# Patient Record
Sex: Male | Born: 1955 | Race: White | Hispanic: No | Marital: Married | Smoking: Never smoker
Health system: Southern US, Community
[De-identification: ages and names within clinical notes are randomized; demographics above are authoritative.]

## PROBLEM LIST (undated history)

## (undated) DIAGNOSIS — K469 Unspecified abdominal hernia without obstruction or gangrene: Secondary | ICD-10-CM

## (undated) DIAGNOSIS — J45909 Unspecified asthma, uncomplicated: Secondary | ICD-10-CM

## (undated) DIAGNOSIS — E119 Type 2 diabetes mellitus without complications: Secondary | ICD-10-CM

## (undated) DIAGNOSIS — G8220 Paraplegia, unspecified: Secondary | ICD-10-CM

## (undated) DIAGNOSIS — K219 Gastro-esophageal reflux disease without esophagitis: Secondary | ICD-10-CM

## (undated) DIAGNOSIS — N419 Inflammatory disease of prostate, unspecified: Secondary | ICD-10-CM

## (undated) DIAGNOSIS — G629 Polyneuropathy, unspecified: Secondary | ICD-10-CM

## (undated) DIAGNOSIS — R7301 Impaired fasting glucose: Secondary | ICD-10-CM

## (undated) HISTORY — DX: Impaired fasting glucose: R73.01

## (undated) HISTORY — DX: Unspecified abdominal hernia without obstruction or gangrene: K46.9

## (undated) HISTORY — DX: Polyneuropathy, unspecified: G62.9

## (undated) HISTORY — DX: Gastro-esophageal reflux disease without esophagitis: K21.9

## (undated) HISTORY — PX: SHOULDER ARTHROSCOPY: SHX128

## (undated) HISTORY — DX: Unspecified asthma, uncomplicated: J45.909

## (undated) HISTORY — PX: CHOLECYSTECTOMY: SHX55

---

## 1997-11-14 ENCOUNTER — Ambulatory Visit (HOSPITAL_COMMUNITY): Admission: RE | Admit: 1997-11-14 | Discharge: 1997-11-14 | Payer: Self-pay | Admitting: Cardiovascular Disease

## 2003-07-31 ENCOUNTER — Ambulatory Visit (HOSPITAL_COMMUNITY): Admission: RE | Admit: 2003-07-31 | Discharge: 2003-07-31 | Payer: Self-pay | Admitting: Family Medicine

## 2005-04-15 ENCOUNTER — Ambulatory Visit (HOSPITAL_COMMUNITY): Admission: RE | Admit: 2005-04-15 | Discharge: 2005-04-15 | Payer: Self-pay | Admitting: Family Medicine

## 2007-03-07 ENCOUNTER — Ambulatory Visit (HOSPITAL_COMMUNITY): Admission: RE | Admit: 2007-03-07 | Discharge: 2007-03-07 | Payer: Self-pay | Admitting: Family Medicine

## 2008-06-13 ENCOUNTER — Encounter (HOSPITAL_COMMUNITY): Admission: RE | Admit: 2008-06-13 | Discharge: 2008-07-13 | Payer: Self-pay | Admitting: Family Medicine

## 2008-12-26 ENCOUNTER — Ambulatory Visit: Payer: Self-pay | Admitting: Internal Medicine

## 2008-12-26 DIAGNOSIS — G4730 Sleep apnea, unspecified: Secondary | ICD-10-CM | POA: Insufficient documentation

## 2008-12-26 DIAGNOSIS — K219 Gastro-esophageal reflux disease without esophagitis: Secondary | ICD-10-CM

## 2008-12-26 DIAGNOSIS — R059 Cough, unspecified: Secondary | ICD-10-CM | POA: Insufficient documentation

## 2008-12-26 DIAGNOSIS — R05 Cough: Secondary | ICD-10-CM

## 2009-01-12 ENCOUNTER — Encounter: Payer: Self-pay | Admitting: Internal Medicine

## 2009-02-05 ENCOUNTER — Encounter (INDEPENDENT_AMBULATORY_CARE_PROVIDER_SITE_OTHER): Payer: Self-pay | Admitting: Surgery

## 2009-02-05 ENCOUNTER — Ambulatory Visit (HOSPITAL_COMMUNITY): Admission: RE | Admit: 2009-02-05 | Discharge: 2009-02-05 | Payer: Self-pay | Admitting: Surgery

## 2009-03-16 ENCOUNTER — Encounter (INDEPENDENT_AMBULATORY_CARE_PROVIDER_SITE_OTHER): Payer: Self-pay | Admitting: *Deleted

## 2009-04-02 ENCOUNTER — Encounter (INDEPENDENT_AMBULATORY_CARE_PROVIDER_SITE_OTHER): Payer: Self-pay

## 2009-04-03 ENCOUNTER — Ambulatory Visit: Payer: Self-pay | Admitting: Gastroenterology

## 2009-04-14 ENCOUNTER — Ambulatory Visit: Payer: Self-pay | Admitting: Gastroenterology

## 2009-04-26 ENCOUNTER — Encounter: Payer: Self-pay | Admitting: Gastroenterology

## 2010-05-02 DIAGNOSIS — K469 Unspecified abdominal hernia without obstruction or gangrene: Secondary | ICD-10-CM

## 2010-05-02 HISTORY — DX: Unspecified abdominal hernia without obstruction or gangrene: K46.9

## 2010-05-02 HISTORY — PX: HERNIA REPAIR: SHX51

## 2010-08-05 LAB — URINALYSIS, ROUTINE W REFLEX MICROSCOPIC
Bilirubin Urine: NEGATIVE
Glucose, UA: NEGATIVE mg/dL
Hgb urine dipstick: NEGATIVE
Ketones, ur: NEGATIVE mg/dL
Nitrite: NEGATIVE
Protein, ur: NEGATIVE mg/dL
Specific Gravity, Urine: 1.025 (ref 1.005–1.030)
Urobilinogen, UA: 0.2 mg/dL (ref 0.0–1.0)
pH: 5.5 (ref 5.0–8.0)

## 2010-08-05 LAB — DIFFERENTIAL
Basophils Absolute: 0.1 10*3/uL (ref 0.0–0.1)
Basophils Relative: 1 % (ref 0–1)
Eosinophils Absolute: 0.3 10*3/uL (ref 0.0–0.7)
Eosinophils Relative: 5 % (ref 0–5)
Lymphocytes Relative: 36 % (ref 12–46)
Lymphs Abs: 2.8 10*3/uL (ref 0.7–4.0)
Monocytes Absolute: 0.6 10*3/uL (ref 0.1–1.0)
Monocytes Relative: 9 % (ref 3–12)
Neutro Abs: 3.8 10*3/uL (ref 1.7–7.7)
Neutrophils Relative %: 50 % (ref 43–77)

## 2010-08-05 LAB — CBC
HCT: 44.4 % (ref 39.0–52.0)
Hemoglobin: 15.2 g/dL (ref 13.0–17.0)
MCHC: 34.3 g/dL (ref 30.0–36.0)
MCV: 91.8 fL (ref 78.0–100.0)
Platelets: 231 10*3/uL (ref 150–400)
RBC: 4.84 MIL/uL (ref 4.22–5.81)
RDW: 13.3 % (ref 11.5–15.5)
WBC: 7.6 10*3/uL (ref 4.0–10.5)

## 2010-08-05 LAB — COMPREHENSIVE METABOLIC PANEL
ALT: 39 U/L (ref 0–53)
AST: 37 U/L (ref 0–37)
Albumin: 3.7 g/dL (ref 3.5–5.2)
Alkaline Phosphatase: 68 U/L (ref 39–117)
BUN: 8 mg/dL (ref 6–23)
CO2: 26 mEq/L (ref 19–32)
Calcium: 9 mg/dL (ref 8.4–10.5)
Chloride: 111 mEq/L (ref 96–112)
Creatinine, Ser: 1.14 mg/dL (ref 0.4–1.5)
GFR calc Af Amer: >60 mL/min (ref >60)
GFR calc non Af Amer: >60 mL/min (ref >60)
Glucose, Bld: 107 mg/dL — ABNORMAL HIGH (ref 70–99)
Potassium: 4.3 mEq/L (ref 3.5–5.1)
Sodium: 143 mEq/L (ref 135–145)
Total Bilirubin: 0.7 mg/dL (ref 0.3–1.2)
Total Protein: 6 g/dL (ref 6.0–8.3)

## 2010-10-20 DIAGNOSIS — K429 Umbilical hernia without obstruction or gangrene: Secondary | ICD-10-CM | POA: Insufficient documentation

## 2010-11-09 ENCOUNTER — Encounter (INDEPENDENT_AMBULATORY_CARE_PROVIDER_SITE_OTHER): Payer: Self-pay | Admitting: Surgery

## 2010-11-09 ENCOUNTER — Ambulatory Visit (INDEPENDENT_AMBULATORY_CARE_PROVIDER_SITE_OTHER): Payer: 59 | Admitting: Surgery

## 2010-11-09 VITALS — BP 142/92 | HR 72 | Temp 96.8°F | Ht 67.0 in | Wt 254.8 lb

## 2010-11-09 DIAGNOSIS — K429 Umbilical hernia without obstruction or gangrene: Secondary | ICD-10-CM

## 2010-11-09 NOTE — Progress Notes (Signed)
Subjective:     Patient ID: Eddie Foley, male   DOB: 1956/05/02, 55 y.o.   MRN: [id number]    BP 142/92  Pulse 72  Temp 96.8 F (36 C)  Ht 5\' 7"  (1.702 m)  Wt 254 lb 12.8 oz (115.577 kg)  BMI 39.91 kg/m2    HPIThis patient developed a bulge just above his umbilicus a few weeks ago. It was quite painful. It measured about 4 cm across by his description. Since then it has come and gone. When he lies down he can get it to go back inside. He saw his primary physician and was thought to have an umbilical hernia and referred to [name] for surgical evaluation. Currently it is reduced and it will not come out he coughs or strains. He points to the area just above the umbilicus as the site of the bulge.   Review of Systems  Constitutional: Negative.   HENT: Negative.   Eyes: Negative.   Respiratory:       Has recurrent bronchitis from reflux  Gastrointestinal:       Significant reflux  Musculoskeletal: Negative.   Neurological: Negative.   Hematological: Negative.   Psychiatric/Behavioral: Negative.   No Known Allergies Past Medical History  Diagnosis Date  . Hernia   . GERD (gastroesophageal reflux disease)    Past Surgical History  Procedure Date  . Cholecystectomy    History  Substance Use Topics  . Smoking status: Never Smoker   . Smokeless tobacco: Not on file  . Alcohol Use: No   Current Outpatient Prescriptions  Medication Sig Dispense Refill  . Cholecalciferol (VITAMIN D3) 3000 UNITS TABS Take by mouth daily.        . famotidine (PEPCID) 20 MG tablet Take 20 mg by mouth daily.        Marland Kitchen ibuprofen (ADVIL,MOTRIN) 200 MG tablet Take 200 mg by mouth every 6 (six) hours as needed.        . Probiotic Product (PROBIOTIC PO) Take by mouth daily.        Marland Kitchen NEXIUM 40 MG capsule daily.             Objective:   Physical Exam  Constitutional: He appears well-developed and well-nourished.  Cardiovascular: Normal rate, regular rhythm and normal heart sounds.     Pulmonary/Chest: Effort normal and breath sounds normal.  Abdominal: Soft. Bowel sounds are normal. He exhibits no distension. There is no tenderness. There is no rebound and no guarding.       Possible small bulge just above the umbilicus.       Assessment:     Umbilical hernia reducible Severe reflux    Plan:     I have recommended that he undergo repair of his umbilical hernia. I told him that we will need to use mesh. I have gone over the risks including infection and recurrences. I think understands about like to go ahead and be scheduled.  I also suggested that he consider further GI evaluation. He has significant reflux, has had chest pain necessitating a cardiac workup because of reflux, and is on more than one medication for his reflux symptoms. In addition he apparently has episodes of bronchial irritation from the reflux. I wonder if he might benefit from an anti-reflux procedure. He will discuss that with his primary care physician

## 2010-11-09 NOTE — Patient Instructions (Signed)
We will schedule surgery for repair of your umbilical hernia Check with your primary care physician to see if you might be a candidate for surgery for your chronic reflux

## 2010-11-25 ENCOUNTER — Encounter (HOSPITAL_COMMUNITY)
Admission: RE | Admit: 2010-11-25 | Discharge: 2010-11-25 | Disposition: A | Discharge status: Home / Self Care | Payer: 59 | Source: Ambulatory Visit | Attending: Surgery | Admitting: Surgery

## 2010-11-25 LAB — SURGICAL PCR SCREEN
MRSA, PCR: NEGATIVE
Staphylococcus aureus: NEGATIVE

## 2010-11-25 LAB — CBC
HCT: 44.7 % (ref 39.0–52.0)
Hemoglobin: 15.5 g/dL (ref 13.0–17.0)
MCH: 30.8 pg (ref 26.0–34.0)
MCHC: 34.7 g/dL (ref 30.0–36.0)
MCV: 88.7 fL (ref 78.0–100.0)
Platelets: 209 10*3/uL (ref 150–400)
RBC: 5.04 MIL/uL (ref 4.22–5.81)
RDW: 13 % (ref 11.5–15.5)
WBC: 9.8 10*3/uL (ref 4.0–10.5)

## 2010-12-02 ENCOUNTER — Ambulatory Visit (HOSPITAL_COMMUNITY)
Admission: RE | Admit: 2010-12-02 | Discharge: 2010-12-02 | Disposition: A | Discharge status: Home / Self Care | Payer: 59 | Source: Ambulatory Visit | Attending: Surgery | Admitting: Surgery

## 2010-12-02 DIAGNOSIS — K219 Gastro-esophageal reflux disease without esophagitis: Secondary | ICD-10-CM | POA: Insufficient documentation

## 2010-12-02 DIAGNOSIS — Z01812 Encounter for preprocedural laboratory examination: Secondary | ICD-10-CM | POA: Insufficient documentation

## 2010-12-02 DIAGNOSIS — K429 Umbilical hernia without obstruction or gangrene: Secondary | ICD-10-CM | POA: Insufficient documentation

## 2010-12-02 DIAGNOSIS — E669 Obesity, unspecified: Secondary | ICD-10-CM | POA: Insufficient documentation

## 2010-12-07 NOTE — Op Note (Addendum)
  NAMEKYLEY, Eddie Foley               ACCOUNT NO.:  [id number]  MEDICAL RECORD NO.:  [id number]  LOCATION:  SDS                          FACILITY:  [facility]  PHYSICIAN:  Currie Paris, M.D.DATE OF BIRTH:  04/21/1956  DATE OF PROCEDURE:  12/02/2010 DATE OF DISCHARGE:  11/25/2010                              OPERATIVE REPORT   PREOPERATIVE DIAGNOSIS:  Umbilical hernia.  POSTOPERATIVE DIAGNOSIS:  Umbilical hernia.  PROCEDURE:  Repair of umbilical hernia with mesh.  SURGEON:  Currie Paris, MD  ANESTHESIA:  General endotracheal.  CLINICAL HISTORY:  This patient presented with an umbilical hernia that had become quite painful, but was reducible.  He wished to have this repaired.  DESCRIPTION OF PROCEDURE:  I saw the patient in the holding area and he had no further questions.  We confirmed the plans for surgery as noted above.  The patient was taken to the operating room, and after satisfactory general endotracheal anesthesia had been obtained, the abdomen was clipped, prepped, and draped.  The time-out was done.  I made a curvilinear infraumbilical incision and elevated the umbilical skin off the hernia sac and entered the sac in doing so.  I cleaned the sac off the surrounding tissue.  I was able to insert my finger into the abdomen and the defect was about 2.5 cm across as a single defect. There were no other defects that I could palpate within reach of my finger in all directions.  I closed the small opening in the peritoneal sac and reduced it.  I cleaned off the fascia circumferentially around the defect.  I took a piece of Marlex mesh and fashioned a piece of it into a cone- shaped plug and placed this into the fascial defect.  I then used five sutures 0 Prolene to close the defect closing fascia to the broad end of the mesh plug to the fascia.  This all tied down easily and with no tension, and I thought it provided a good repair.  I made sure everything  was dry.  I closed in layers with 3-0 Vicryl, 4-0 Monocryl subcuticular and some Steri-Strips.  I put a cotton ball with some mineral oil to hold "any" down and sterile dressings on top.  The patient tolerated the procedure well and there were no complications.  All  counts were correct.     Currie Paris, M.D.     CJS/MEDQ  D:  12/02/2010  T:  12/02/2010  Job:  [id number]  cc:   Donna Bernard, M.D.  Electronically Signed by Cyndia Bent M.D. on 12/14/2010 07:52:45 AM

## 2010-12-22 ENCOUNTER — Encounter (INDEPENDENT_AMBULATORY_CARE_PROVIDER_SITE_OTHER): Payer: Self-pay | Admitting: Surgery

## 2010-12-22 ENCOUNTER — Ambulatory Visit (INDEPENDENT_AMBULATORY_CARE_PROVIDER_SITE_OTHER): Payer: 59 | Admitting: Surgery

## 2010-12-22 VITALS — BP 142/92 | HR 64

## 2010-12-22 DIAGNOSIS — Z9889 Other specified postprocedural states: Secondary | ICD-10-CM

## 2010-12-22 NOTE — Patient Instructions (Signed)
We will see you again on an as needed basis. Please call the office at [phone] if you have any questions or concerns. Thank you for allowing us to take care of you.  

## 2010-12-22 NOTE — Progress Notes (Signed)
CC: Postop is  HPI: This patient comes in for post op follow-up. He underwent repair of umbilical hernia on December 01, 2009. He feels that he is doing well. He has had minimal pain. He only took pain medications for two days. He feels significantly improved.  PE: General: The patient appears to be healthy, NAD  Soft and benign. There is minor ridging at the top of the umbilicus. There is no evidence of infection. The repair appears intact.  IMPRESSION: The patient is doing well S/P umbilical hernia repair.  DATA REVIWED: No new data  PLAN: Recheck here p.r.n.

## 2012-11-16 ENCOUNTER — Other Ambulatory Visit: Payer: Self-pay | Admitting: Family Medicine

## 2013-04-23 ENCOUNTER — Encounter: Payer: Self-pay | Admitting: Family Medicine

## 2013-05-12 ENCOUNTER — Other Ambulatory Visit: Payer: Self-pay | Admitting: Family Medicine

## 2013-05-13 ENCOUNTER — Ambulatory Visit: Payer: Self-pay | Admitting: Family Medicine

## 2013-05-13 ENCOUNTER — Telehealth: Payer: Self-pay | Admitting: Family Medicine

## 2013-05-13 MED ORDER — HYDROCODONE-ACETAMINOPHEN 10-325 MG PO TABS: 1.0000 | ORAL_TABLET | Freq: Four times a day (QID) | ORAL | Status: DC | PRN

## 2013-05-13 NOTE — Telephone Encounter (Signed)
May refill x1, explained to patient that nail this type of medicine will require office visit every 3 month visit for further medicines.

## 2013-05-13 NOTE — Telephone Encounter (Signed)
Patient needs Rx for hydrocodone 10mg 

## 2013-05-13 NOTE — Telephone Encounter (Signed)
Not seen since Epic.

## 2013-05-13 NOTE — Telephone Encounter (Signed)
Appointment tomorrow for chronic pain per Dr. Nicki Reaper with him ONLY. Script ready for pickup. Wife was notified and transferred to front desk to schedule appointment.

## 2013-06-04 ENCOUNTER — Other Ambulatory Visit: Payer: Self-pay | Admitting: Family Medicine

## 2013-06-05 ENCOUNTER — Telehealth: Payer: Self-pay | Admitting: Family Medicine

## 2013-06-05 NOTE — Telephone Encounter (Signed)
Patient needs Rx for Nexium to [facility].

## 2013-06-05 NOTE — Telephone Encounter (Addendum)
Request was already sent via e script and was sent electronically back to pharmacy for #14 -Patient not seen in a year and needs office visit.

## 2013-06-13 ENCOUNTER — Other Ambulatory Visit: Payer: Self-pay | Admitting: Family Medicine

## 2013-08-06 ENCOUNTER — Encounter: Payer: Self-pay | Admitting: *Deleted

## 2014-01-31 ENCOUNTER — Encounter: Payer: Self-pay | Admitting: Gastroenterology

## 2014-06-27 ENCOUNTER — Other Ambulatory Visit: Payer: Self-pay | Admitting: *Deleted

## 2014-06-27 ENCOUNTER — Encounter: Payer: Self-pay | Admitting: Family Medicine

## 2014-06-27 ENCOUNTER — Ambulatory Visit (INDEPENDENT_AMBULATORY_CARE_PROVIDER_SITE_OTHER): Payer: BLUE CROSS/BLUE SHIELD | Admitting: Family Medicine

## 2014-06-27 VITALS — BP 132/96 | Temp 98.9°F | Ht 67.0 in | Wt 261.0 lb

## 2014-06-27 DIAGNOSIS — Z13220 Encounter for screening for lipoid disorders: Secondary | ICD-10-CM | POA: Diagnosis not present

## 2014-06-27 DIAGNOSIS — N410 Acute prostatitis: Secondary | ICD-10-CM | POA: Diagnosis not present

## 2014-06-27 DIAGNOSIS — R300 Dysuria: Secondary | ICD-10-CM | POA: Diagnosis not present

## 2014-06-27 DIAGNOSIS — Z125 Encounter for screening for malignant neoplasm of prostate: Secondary | ICD-10-CM

## 2014-06-27 LAB — POCT URINALYSIS DIPSTICK
SPEC GRAV UA: 1.02
pH, UA: 5

## 2014-06-27 MED ORDER — CIPROFLOXACIN HCL 500 MG PO TABS: 500.0000 mg | ORAL_TABLET | Freq: Two times a day (BID) | ORAL | Status: DC

## 2014-06-27 MED ORDER — HYDROCODONE-ACETAMINOPHEN 5-325 MG PO TABS: 1.0000 | ORAL_TABLET | Freq: Four times a day (QID) | ORAL | Status: DC | PRN

## 2014-06-27 NOTE — Patient Instructions (Signed)

## 2014-06-27 NOTE — Progress Notes (Signed)
   Subjective:    Patient ID: Eddie Foley, male    DOB: 01/11/1956, 59 y.o.   MRN: [id number]  Dysuria  This is a new problem. Episode onset: 8 days ago. Associated symptoms include frequency and nausea. He has tried NSAIDs for the symptoms.    Patient states that he has had some urinary from C lower abdominal discomfort bilateral midabdomen denies new vomiting diarrhea. Denies high fever chills or sweats. States energy level is poor. 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  Review of Systems  Constitutional: Negative for fatigue.  Respiratory: Negative for cough.   Cardiovascular: Negative for chest pain.  Gastrointestinal: Positive for nausea and abdominal pain. Negative for diarrhea.  Genitourinary: Positive for dysuria and frequency.       Objective:   Physical Exam  Constitutional: He appears well-nourished. No distress.  Cardiovascular: Normal rate, regular rhythm and normal heart sounds.   No murmur heard. Pulmonary/Chest: Effort normal and breath sounds normal. No respiratory distress.  Abdominal: Soft. There is tenderness (patient has some tenderness more in the lower bladder region a little bit in the left lower right lower quadrant in the middle of the abdomen as well.).  Genitourinary:  Prostate exam severe tenderness in large swollen no hard nodules  Musculoskeletal: He exhibits no edema.  Lymphadenopathy:    He has no cervical adenopathy.  Neurological: He is alert.  Psychiatric: His behavior is normal.  Vitals reviewed.   Mild lower abdominal tenderness enlarged prostate moderate tenderness and pain      Assessment & Plan:  I would recommend recheck this patient back in several weeks' time also recommend for him to do lab work in several weeks time. Use Cipro 500 mg twice a day over the course of the next 3 weeks. Pain medication as directed. Warning signs discuss of high fever chills abdominal pain or does not tolerate antibiotic or if not  significantly improving over the next 7 days call us immediately

## 2014-06-29 LAB — URINE CULTURE
Colony Count: NO GROWTH
Organism ID, Bacteria: NO GROWTH

## 2014-07-07 ENCOUNTER — Telehealth: Payer: Self-pay | Admitting: Family Medicine

## 2014-07-07 NOTE — Telephone Encounter (Signed)
Patient seen 2/26 given Cipro and Hydrocodone- patient no better- continued back pain into kidneys-sharp pain in prostate and on right side -does he need to be reseen

## 2014-07-07 NOTE — Telephone Encounter (Signed)
Pts spouse is calling to state that he really is not any better. He now has back pain  To the kidney area around his back. Sharp pain on the right side from the prostate.  Does he need to be rechecked? Tested? Or another antibiotic?   [facility]

## 2014-07-07 NOTE — Telephone Encounter (Signed)
Pt needs to be seen, he will need further testing and may need referral as well. Talk with pt needs ov T  Or weds depending on Sx

## 2014-07-07 NOTE — Telephone Encounter (Signed)
Office visit scheduled tomorrow for recheck-ER tonight if worse.

## 2014-07-08 ENCOUNTER — Ambulatory Visit (HOSPITAL_COMMUNITY)
Admission: RE | Admit: 2014-07-08 | Discharge: 2014-07-08 | Disposition: A | Discharge status: Home / Self Care | Payer: BLUE CROSS/BLUE SHIELD | Source: Ambulatory Visit | Attending: Family Medicine | Admitting: Family Medicine

## 2014-07-08 ENCOUNTER — Ambulatory Visit (INDEPENDENT_AMBULATORY_CARE_PROVIDER_SITE_OTHER): Payer: BLUE CROSS/BLUE SHIELD | Admitting: Family Medicine

## 2014-07-08 ENCOUNTER — Encounter: Payer: Self-pay | Admitting: Family Medicine

## 2014-07-08 VITALS — BP 150/92 | Ht 67.0 in | Wt 256.0 lb

## 2014-07-08 DIAGNOSIS — M545 Low back pain: Secondary | ICD-10-CM | POA: Diagnosis not present

## 2014-07-08 DIAGNOSIS — M5442 Lumbago with sciatica, left side: Secondary | ICD-10-CM

## 2014-07-08 MED ORDER — PREDNISONE 20 MG PO TABS: ORAL_TABLET | ORAL | Status: DC

## 2014-07-08 MED ORDER — HYDROCODONE-ACETAMINOPHEN 10-325 MG PO TABS: 1.0000 | ORAL_TABLET | Freq: Four times a day (QID) | ORAL | Status: DC | PRN

## 2014-07-08 NOTE — Progress Notes (Signed)
   Subjective:    Patient ID: Eddie Foley, male    DOB: Dec 05, 1955, 59 y.o.   MRN: [id number]  HPI Comments: Still having slight pelvic pain, but 80% better.  Back Pain This is a chronic problem. Episode onset: Sunday. The problem occurs constantly. The problem has been gradually worsening since onset. The pain is present in the gluteal. The symptoms are aggravated by standing. Stiffness is present in the morning. Associated symptoms include numbness and tingling. He has tried heat, muscle relaxant and analgesics for the symptoms. The treatment provided no relief.   Patient relates that he is having severe back pain wakes him up at night. He relates the pain radiates into both thighs. He states he has a hard time getting out of a chair without his wife helping him up. In addition to this he has a hard time getting in and out of the truck. This pain is been going on for a good 6-8 weeks. It got worse over the weekend. He also relates that he's been having spells like this off and on over the past several years that he often treats with anti-inflammatories and stretching but it's been progressively worse over the past year. He states that it's to the point where he really can't continue his work. He states it is not interrupting urination or bowel movements. He did try taking hydrocodone and Advil for neither one really helped a lot. Patient had MRI back in 2005.   Review of Systems  Musculoskeletal: Positive for back pain.  Neurological: Positive for tingling and numbness.   see above. ( Fridays best for MRI )    Objective:   Physical Exam Patient appears to be uncomfortable. His lungs are clear respiratory rate is normal heart is regular pulses normal extremities no edema positive straight leg raise on both sides worse on the right than the left abdomen is soft minimal lower abdominal tenderness on the left side lumbar area subjective pain and discomfort severely limited range of motion has  difficult time rotating has difficult time standing up straight difficult time with extension. He cannot get out of the chair without his wife helping him he cannot sit up on the exam table without help.       Assessment & Plan:  Severe lumbar pain with sciatica bilateral worse on the right than the left-given that this is been present for months off and on plus also severe pain over the past 6 weeks worse over the past week I would recommend lumbar x-rays. I believe this patient will need MRI of his lumbar spine. We will pursue that.  Patient is aware that if his insurance company does not cover this we may need to do 6 weeks of anti-inflammatory stretching. He essentially has Arty dullness on his own so I believe that it's pointless. I also feel that this patient is going to need to see a back specialist. MRI is necessary in order to help delineate if this is a surgical issue or something that can be injected.   25 minutes spent with patient discussing his back issue.

## 2014-07-10 ENCOUNTER — Telehealth: Payer: Self-pay | Admitting: Family Medicine

## 2014-07-10 DIAGNOSIS — M5442 Lumbago with sciatica, left side: Secondary | ICD-10-CM

## 2014-07-10 NOTE — Telephone Encounter (Signed)
Calling to get results to xray

## 2014-07-10 NOTE — Telephone Encounter (Signed)
Nurses, I already resulted this please call pt with results and schedule MRI- lumbar without contrast see note

## 2014-07-11 NOTE — Telephone Encounter (Signed)
Notified patient MRI scheduled for 07/18/14 at 1:45 pm. Patient verbalized understanding.

## 2014-07-11 NOTE — Telephone Encounter (Signed)
Results was given to patient  

## 2014-07-18 ENCOUNTER — Telehealth: Payer: Self-pay | Admitting: Family Medicine

## 2014-07-18 ENCOUNTER — Ambulatory Visit (HOSPITAL_COMMUNITY): Admission: RE | Admit: 2014-07-18 | Payer: BLUE CROSS/BLUE SHIELD | Source: Ambulatory Visit

## 2014-07-18 ENCOUNTER — Other Ambulatory Visit: Payer: Self-pay | Admitting: *Deleted

## 2014-07-18 DIAGNOSIS — M8580 Other specified disorders of bone density and structure, unspecified site: Secondary | ICD-10-CM

## 2014-07-18 DIAGNOSIS — M545 Low back pain: Secondary | ICD-10-CM

## 2014-07-18 NOTE — Telephone Encounter (Signed)
Test rescheduled at [place] April 1st 4 pm. Register 3:45 at main entrance of [facility]. Pt notified.

## 2014-07-18 NOTE — Telephone Encounter (Signed)
Patient was supposed to have blood work done next week, but will be out of town.  Can he do that blood work today?  Also, he said he was supposed to have a bone density test done and has not heard anything.

## 2014-07-18 NOTE — Telephone Encounter (Signed)
Bone density scheduled [facility] April 1st. Register 8:45 for a 9 am appt. Pt notified of appt and location and to bring a list of meds.

## 2014-07-18 NOTE — Telephone Encounter (Signed)
Set up bone density reason osteopenia

## 2014-07-18 NOTE — Telephone Encounter (Signed)
Pt couldn't do MRI at [facility] due to not fitting in the machine, states radiology told him the machine at [facility] is bigger.  Patient would like to be scheduled for MRI at [facility] (prior Josem Kaufmann is done and expires 08/13/14) Please call pt when done [phone]

## 2014-07-19 LAB — BASIC METABOLIC PANEL
BUN / CREAT RATIO: 15 (ref 10–22)
BUN: 15 mg/dL (ref 8–27)
CALCIUM: 8.9 mg/dL (ref 8.6–10.2)
CHLORIDE: 104 mmol/L (ref 97–108)
CO2: 25 mmol/L (ref 18–29)
Creatinine, Ser: 1 mg/dL (ref 0.76–1.27)
GFR calc non Af Amer: 81 mL/min/{1.73_m2} (ref >59)
GFR, EST AFRICAN AMERICAN: 94 mL/min/{1.73_m2} (ref >59)
Glucose: 101 mg/dL — ABNORMAL HIGH (ref 65–99)
Potassium: 5 mmol/L (ref 3.5–5.2)
Sodium: 142 mmol/L (ref 134–144)

## 2014-07-19 LAB — LIPID PANEL
Chol/HDL Ratio: 3.3 ratio units (ref 0.0–5.0)
Cholesterol, Total: 140 mg/dL (ref 100–199)
HDL: 43 mg/dL (ref >39)
LDL Calculated: 66 mg/dL (ref 0–99)
Triglycerides: 157 mg/dL — ABNORMAL HIGH (ref 0–149)
VLDL CHOLESTEROL CAL: 31 mg/dL (ref 5–40)

## 2014-07-19 LAB — PSA: PSA: 1.2 ng/mL (ref 0.0–4.0)

## 2014-07-31 ENCOUNTER — Other Ambulatory Visit: Payer: Self-pay | Admitting: Family Medicine

## 2014-08-01 ENCOUNTER — Ambulatory Visit (HOSPITAL_COMMUNITY)
Admission: RE | Admit: 2014-08-01 | Discharge: 2014-08-01 | Disposition: A | Discharge status: Home / Self Care | Payer: BLUE CROSS/BLUE SHIELD | Source: Ambulatory Visit | Attending: Family Medicine | Admitting: Family Medicine

## 2014-08-01 DIAGNOSIS — M8580 Other specified disorders of bone density and structure, unspecified site: Secondary | ICD-10-CM | POA: Diagnosis present

## 2014-08-04 ENCOUNTER — Other Ambulatory Visit: Payer: Self-pay

## 2014-08-04 DIAGNOSIS — M5442 Lumbago with sciatica, left side: Secondary | ICD-10-CM

## 2014-08-08 ENCOUNTER — Encounter: Payer: Self-pay | Admitting: Family Medicine

## 2014-08-08 ENCOUNTER — Ambulatory Visit (INDEPENDENT_AMBULATORY_CARE_PROVIDER_SITE_OTHER): Payer: BLUE CROSS/BLUE SHIELD | Admitting: Family Medicine

## 2014-08-08 VITALS — BP 134/92 | Ht 67.0 in | Wt 264.0 lb

## 2014-08-08 DIAGNOSIS — M5442 Lumbago with sciatica, left side: Secondary | ICD-10-CM | POA: Diagnosis not present

## 2014-08-08 MED ORDER — PREDNISONE 20 MG PO TABS: ORAL_TABLET | ORAL | Status: DC

## 2014-08-08 NOTE — Progress Notes (Signed)
   Subjective:    Patient ID: Eddie Foley, male    DOB: 02-May-1956, 59 y.o.   MRN: [id number]  HPI Patient is here today for a 6 week recheck on his low back pain.  He had his MRI done. Referral to neurosurgeon.  Patient re injured his back this morning when he was putting on his shoes. Heard a loud "pop" sound from his back.  Out of his pain med, but does not want a refill b/c he said it makes him feel sick.  Refill of muscle relaxer was sent on 4/1. Pt notified.       Review of Systems  relates pain discomfort into both legs worse on the leftside than the right side worse over the past 24 hours    Objective:   Physical Exam   positive straight leg raise on the left some on the right as well but is worse on the left lungs clear hearts regular pulse normal BP good  tender in the low part of his back on the left. Has difficult time raising his foot but is able to raise it but it causes pain  I reviewed overall of his lab work with him.    Assessment & Plan:   lumbar pain with herniated disc I recommend referral to neurosurgery await the results of this. It is possible they may recommend surgery for L3-L4 it is also possible that they may just recommend physical therapy and injections. The patient currently cannot do his job. He is contemplating disability if things don't get better. Certainly if things don't get better I would state that he is disabled.   one additional round of steroids today

## 2014-08-10 ENCOUNTER — Telehealth: Payer: Self-pay | Admitting: Family Medicine

## 2014-08-10 DIAGNOSIS — D369 Benign neoplasm, unspecified site: Secondary | ICD-10-CM

## 2014-08-10 NOTE — Telephone Encounter (Signed)
Let Eddie Foley know I looked into his colonoscopy situation.Eddie Foley does need repeat colonoscopy with Dr Deatra Ina, it was recommended to be done 12 2015. He can set follow up appt with him, plz give Eddie Foley Dr Ephraim Hamburger he has to do is tell dr Deatra Ina office he is due for follow up colonoscopy if any issues call us and we can help

## 2014-08-11 NOTE — Telephone Encounter (Signed)
Discussed with pt. Dr. Kelby Fam number given to pt to call and schedule his appt.

## 2014-09-08 ENCOUNTER — Encounter: Payer: Self-pay | Admitting: Gastroenterology

## 2014-09-15 ENCOUNTER — Telehealth: Payer: Self-pay | Admitting: Family Medicine

## 2014-09-15 NOTE — Telephone Encounter (Signed)
HYDROcodone-acetaminophen (NORCO) 10-325 MG per tablet   Pt states he needs a refill on this med please

## 2014-09-15 NOTE — Telephone Encounter (Signed)
Error wrong patient

## 2014-09-24 ENCOUNTER — Ambulatory Visit (INDEPENDENT_AMBULATORY_CARE_PROVIDER_SITE_OTHER): Payer: BLUE CROSS/BLUE SHIELD | Admitting: Family Medicine

## 2014-09-24 ENCOUNTER — Encounter: Payer: Self-pay | Admitting: Family Medicine

## 2014-09-24 VITALS — BP 134/88 | Temp 98.3°F | Ht 67.0 in | Wt 263.0 lb

## 2014-09-24 DIAGNOSIS — T63891A Toxic effect of contact with other venomous animals, accidental (unintentional), initial encounter: Secondary | ICD-10-CM | POA: Diagnosis not present

## 2014-09-24 DIAGNOSIS — T63481A Toxic effect of venom of other arthropod, accidental (unintentional), initial encounter: Secondary | ICD-10-CM

## 2014-09-24 MED ORDER — METHYLPREDNISOLONE ACETATE 40 MG/ML IJ SUSP
40.0000 mg | Freq: Once | INTRAMUSCULAR | Status: AC
  Administered 2014-09-24: 40 mg via INTRAMUSCULAR

## 2014-09-24 MED ORDER — PREDNISONE 20 MG PO TABS: ORAL_TABLET | ORAL | Status: DC

## 2014-09-24 MED ORDER — EPINEPHRINE 0.3 MG/0.3ML IJ SOAJ: 0.3000 mg | Freq: Once | INTRAMUSCULAR | Status: AC

## 2014-09-24 NOTE — Progress Notes (Signed)
   Subjective:    Patient ID: Eddie Foley, male    DOB: 1956-02-14, 59 y.o.   MRN: [id number]  HPIInsect bite on right hand and right side of neck. Pt states it was an ant that had wings. Swelling, redness, itching and pain at site. Using benadryl pill and cream. Mild relief.   He is had numerous bug bites in the past few days please never had any extensive swelling other than localized swelling never had systemic symptoms never had throat closing but he states all insects cause swelling for him usually takes Benadryl but when it gets moderately swollen it makes him worried he got been on the neck therefore he's worried that it's going to get large  Review of Systems     Objective:   Physical Exam  Multiple bug bites noted. Some swelling around. Some swelling in the neck but no airway compromise. Patient concerned that this will get progressively worse      Assessment & Plan:  I recommend prednisone taper. He was given a refill encases happens again in the future Benadryl when necessary itching May use 0 tach or loratadine daily Also recommend EpiPen just in case he has a severe reaction he was educated on Wednesday use EpiPen in that if he uses EpiPen to immediately go to ER via 911

## 2014-12-12 ENCOUNTER — Encounter: Payer: Self-pay | Admitting: Family Medicine

## 2014-12-12 ENCOUNTER — Ambulatory Visit (INDEPENDENT_AMBULATORY_CARE_PROVIDER_SITE_OTHER): Payer: BLUE CROSS/BLUE SHIELD | Admitting: Family Medicine

## 2014-12-12 VITALS — BP 144/90 | Ht 67.0 in | Wt 267.4 lb

## 2014-12-12 DIAGNOSIS — R300 Dysuria: Secondary | ICD-10-CM

## 2014-12-12 DIAGNOSIS — N410 Acute prostatitis: Secondary | ICD-10-CM

## 2014-12-12 LAB — POCT URINALYSIS DIPSTICK
KETONES UA: POSITIVE
Spec Grav, UA: 1.02
Urobilinogen, UA: 0.2
pH, UA: 5

## 2014-12-12 MED ORDER — CEFTRIAXONE SODIUM 1 G IJ SOLR
1.0000 g | Freq: Once | INTRAMUSCULAR | Status: AC
Start: 2014-12-12 — End: 2014-12-12
  Administered 2014-12-12: 1 g via INTRAMUSCULAR

## 2014-12-12 MED ORDER — PHENAZOPYRIDINE HCL 200 MG PO TABS: 200.0000 mg | ORAL_TABLET | Freq: Three times a day (TID) | ORAL | Status: DC | PRN

## 2014-12-12 MED ORDER — CIPROFLOXACIN HCL 500 MG PO TABS: 500.0000 mg | ORAL_TABLET | Freq: Two times a day (BID) | ORAL | Status: DC

## 2014-12-12 NOTE — Progress Notes (Signed)
   Subjective:    Patient ID: Eddie Foley, male    DOB: 10-Aug-1955, 59 y.o.   MRN: [id number]  Dysuria  This is a recurrent problem. The current episode started in the past 7 days. The problem occurs every urination. The problem has been gradually worsening. There has been no fever. Associated symptoms include frequency. Pertinent negatives include no nausea. Associated symptoms comments: Sharp pains in lower abdomen upon urination.. He has tried NSAIDs (Advil) for the symptoms.    Patient states that he is having symptoms of dysuria. Patient states that he believes that it is caused by his prostate because he has had theses symptoms in the past. Patient states no other concerns this visit.  Review of Systems  Constitutional: Negative for fever and fatigue.  Gastrointestinal: Negative for nausea, abdominal pain, diarrhea and abdominal distention.  Genitourinary: Positive for dysuria and frequency.  Musculoskeletal: Negative for back pain and arthralgias.   Urinalysis occasional WBC    Objective:   Physical Exam  Lungs clear heart regular abdomen soft subjected lower abdominal tenderness prostate enlarged and tender      Assessment & Plan:  Probable prostatitis treatment measures discussed. Recent PSA earlier this year looked good. Antibiotics prescribed. Pyridium for discomfort. Follow-up if progressive troubles. Offered urology referral if ongoing troubles.

## 2015-01-14 ENCOUNTER — Other Ambulatory Visit: Payer: Self-pay | Admitting: Family Medicine

## 2015-02-20 ENCOUNTER — Encounter: Payer: Self-pay | Admitting: Family Medicine

## 2015-02-20 ENCOUNTER — Ambulatory Visit (INDEPENDENT_AMBULATORY_CARE_PROVIDER_SITE_OTHER): Payer: BLUE CROSS/BLUE SHIELD | Admitting: Family Medicine

## 2015-02-20 VITALS — BP 122/78 | Ht 67.0 in | Wt 267.4 lb

## 2015-02-20 DIAGNOSIS — H1132 Conjunctival hemorrhage, left eye: Secondary | ICD-10-CM

## 2015-02-20 DIAGNOSIS — Z125 Encounter for screening for malignant neoplasm of prostate: Secondary | ICD-10-CM | POA: Diagnosis not present

## 2015-02-20 DIAGNOSIS — G8929 Other chronic pain: Secondary | ICD-10-CM

## 2015-02-20 DIAGNOSIS — G609 Hereditary and idiopathic neuropathy, unspecified: Secondary | ICD-10-CM | POA: Diagnosis not present

## 2015-02-20 DIAGNOSIS — M25512 Pain in left shoulder: Secondary | ICD-10-CM

## 2015-02-20 DIAGNOSIS — E785 Hyperlipidemia, unspecified: Secondary | ICD-10-CM | POA: Diagnosis not present

## 2015-02-20 DIAGNOSIS — Z79899 Other long term (current) drug therapy: Secondary | ICD-10-CM

## 2015-02-20 NOTE — Progress Notes (Signed)
   Subjective:    Patient ID: Eddie Foley, male    DOB: [birth year], 59 y.o.   MRN: [id number]  HPI  patient arrives office with numerous concerns.   Patient arrives for c/o continued left shoulder pain- it locks up on him at times. Patient stated he had surgery on shoulder about 20 years ago.shoulder progresssive pain worse with certain motions very painfyl  Hears it popping cna crackling  No sudden injury , took otc advil helped soe bu not a lot  Left hand ed deep ache and pain    Patient also having trouble with worsening numbness in feet and legs.  Now eye discomfort, and bleeding and uncomfortable , patient concerned about this. Some development of red splotches my admits a lot of Goody powder use.  Progressive numbnesw worsening, ostly oin feet from the kees down, legs get weak at times trouble with balance., Now definitely affecting gait,  Has long-standing history of neuropathy. Evaluated near 10 years ago. Patient claims no significant evaluation since. Causing more more difficulties getting around.   Dr Dorothe Pea caff    patient Review of Systems  no current headache no chest pain no abdominal pain no shortness breath no change in bowel habits complete ROS otherwise negative    Objective:   Physical Exam   alert vitals stable blood pressure good on repeat left eye scleral hemorrhage evident October exam otherwise normal lungs clear heart rare rhythm left shoulder significant impingement sign questionable element of frozen shoulder feet pulses adequate distal sensation substantially diminished.      Assessment & Plan:   impression 1 progressive sensory neuropathy. No recent workup for evaluation per patient now becoming in his view nearly disabling #2 chronic left shoulder pain with substantial worsening #3 scleral hemorrhage discussed and reassured but advised to cut down gritty patters plan orthopedic referral. Nerve conduction studies. Appropriate blood work both  for screening and neuropathy. Follow-up with Dr. Nicki Reaper in several weeks [facility]

## 2015-02-22 ENCOUNTER — Encounter: Payer: Self-pay | Admitting: Family Medicine

## 2015-02-22 LAB — TSH: TSH: 2.62 u[IU]/mL (ref 0.450–4.500)

## 2015-02-22 LAB — BASIC METABOLIC PANEL
BUN/Creatinine Ratio: 11 (ref 10–22)
BUN: 12 mg/dL (ref 8–27)
CO2: 22 mmol/L (ref 18–29)
Calcium: 9.3 mg/dL (ref 8.6–10.2)
Chloride: 103 mmol/L (ref 97–106)
Creatinine, Ser: 1.08 mg/dL (ref 0.76–1.27)
GFR calc non Af Amer: 74 mL/min/{1.73_m2} (ref >59)
GFR, EST AFRICAN AMERICAN: 86 mL/min/{1.73_m2} (ref >59)
GLUCOSE: 106 mg/dL — AB (ref 65–99)
Potassium: 4.9 mmol/L (ref 3.5–5.2)
SODIUM: 145 mmol/L — AB (ref 136–144)

## 2015-02-22 LAB — HEPATIC FUNCTION PANEL
ALK PHOS: 70 IU/L (ref 39–117)
ALT: 30 IU/L (ref 0–44)
AST: 25 IU/L (ref 0–40)
Albumin: 4.1 g/dL (ref 3.6–4.8)
BILIRUBIN, DIRECT: 0.09 mg/dL (ref 0.00–0.40)
Bilirubin Total: 0.2 mg/dL (ref 0.0–1.2)
TOTAL PROTEIN: 6.2 g/dL (ref 6.0–8.5)

## 2015-02-22 LAB — LIPID PANEL
Chol/HDL Ratio: 3.9 ratio units (ref 0.0–5.0)
Cholesterol, Total: 130 mg/dL (ref 100–199)
HDL: 33 mg/dL — ABNORMAL LOW (ref >39)
LDL Calculated: 61 mg/dL (ref 0–99)
Triglycerides: 178 mg/dL — ABNORMAL HIGH (ref 0–149)
VLDL Cholesterol Cal: 36 mg/dL (ref 5–40)

## 2015-02-22 LAB — FOLATE: Folate: 7.9 ng/mL (ref >3.0)

## 2015-02-22 LAB — PSA: Prostate Specific Ag, Serum: 0.8 ng/mL (ref 0.0–4.0)

## 2015-02-22 LAB — VITAMIN B12: Vitamin B-12: 510 pg/mL (ref 211–946)

## 2015-02-23 ENCOUNTER — Other Ambulatory Visit: Payer: Self-pay | Admitting: *Deleted

## 2015-02-23 DIAGNOSIS — G609 Hereditary and idiopathic neuropathy, unspecified: Secondary | ICD-10-CM

## 2015-02-24 ENCOUNTER — Ambulatory Visit (INDEPENDENT_AMBULATORY_CARE_PROVIDER_SITE_OTHER): Payer: BLUE CROSS/BLUE SHIELD | Admitting: Neurology

## 2015-02-24 DIAGNOSIS — G609 Hereditary and idiopathic neuropathy, unspecified: Secondary | ICD-10-CM | POA: Diagnosis not present

## 2015-02-24 NOTE — Procedures (Signed)
[facility]  [place], [place]  [place], [place] [address] Tel: [phone] Fax:  [phone] Test Date:  02/24/2015  Patient: Eddie Foley DOB: 13-Jun-1955 Physician: Narda Amber  Sex: Male Height: 5\' 7"  Ref Phys: Trevor Iha, M.D.  ID#: [id number] Temp: 32.3C Technician: Jerilynn Mages. Dean   Patient Complaints: This is a 59 year-old gentleman with history of neuropathy presenting with worsening numbness and gait instability over the past several months.   NCV & EMG Findings: Extensive electrodiagnostic testing of the right lower extremity and additional studies of the left shows: 1. Bilateral sural and superficial peroneal sensory responses are absent. 2. Bilateral tibial and peroneal (EDB) motor responses are absent. Bilateral peroneal motor responses recording at the tibialis anterior are moderately reduced, with preserved latency and conduction velocity. 3. Bilateral tibial H reflex studies are absent. 4. Severe active on chronic motor axon loss changes are seen affecting the muscles the lower extremities and conform to a gradient pattern and worse distally. Active motor axon loss changes are seen involving the lumbar paraspinal muscles.  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 Overall, these findings are severe in degree electrically.  Neurological consultation and additional electrodiagnostic testing of the upper extremities is recommended.  _____________________________ Narda Amber, D.O.    Nerve Conduction Studies Anti Sensory Summary Table   Stim Site NR Peak (ms) Norm Peak (ms) P-T Amp (V) Norm P-T Amp  Left Sup Peroneal Anti Sensory (Ant Lat Mall)  32.3C  12 cm NR  <4.6  >3  Right Sup Peroneal Anti Sensory (Ant Lat Mall)  32.3C  12 cm NR  <4.6  >3  Left Sural Anti Sensory (Lat Mall)  32.3C  Calf NR   <4.6  >3  Right Sural Anti Sensory (Lat Mall)  32.3C  Calf NR  <4.6  >3   Motor Summary Table   Stim Site NR Onset (ms) Norm Onset (ms) O-P Amp (mV) Norm O-P Amp Site1 Site2 Delta-0 (ms) Dist (cm) Vel (m/s) Norm Vel (m/s)  Left Peroneal Motor (Ext Dig Brev)  32.3C  Ankle NR  <6.0  >2.5 B Fib Ankle  0.0  >40  B Fib NR     Poplt B Fib  0.0  >40  Poplt NR            Right Peroneal Motor (Ext Dig Brev)  32.3C  Ankle NR  <6.0  >2.5 B Fib Ankle  0.0  >40  B Fib NR     Poplt B Fib  0.0  >40  Poplt NR            Left Peroneal TA Motor (Tib Ant)  32.3C  Fib Head    3.3 <4.5 1.2 >3 Poplit Fib Head 2.1 0.0 50 >40  Poplit    5.4  0.9         Right Peroneal TA Motor (Tib Ant)  32.3C  Fib Head    2.9 <4.5 2.1 >3 Poplit Fib Head 1.9 10.0 53 >40  Poplit    4.8  1.7         Left Tibial Motor (Abd Hall Brev)  32.3C  Ankle NR  <6.0  >4 Knee Ankle  0.0  >40  Knee NR            Right Tibial Motor (Abd Hall Brev)  32.3C  Ankle NR  <6.0  >4 Knee Ankle  0.0  >40  Knee NR             H Reflex Studies   NR H-Lat (ms) Lat Norm (ms) L-R H-Lat (ms)  Left Tibial (Gastroc)  32.3C  NR  <35   Right Tibial (Gastroc)  32.3C    NR  <35    EMG   Side Muscle Ins Act Fibs Psw Fasc Number Recrt Dur Dur. Amp Amp. Poly Poly. Comment  Right AntTibialis Nml 3+ Nml Nml 2- Rapid Many 1+ Many 1+ Nml Nml N/A  Right Gastroc Nml 2+ Nml Nml 3- Mod-R Most 1+ Nml Nml Nml Nml N/A  Right Flex Dig Long Nml 1+ Nml Nml SMU Rapid All 1+ All 1+ Nml Nml N/A  Right RectFemoris Nml Nml Nml Nml 1- Rapid Some 1+ Nml Nml Nml Nml N/A  Right BicepsFemS Nml Nml Nml Nml 1- Rapid Some 1+ Some 1+ Nml Nml N/A  Right GluteusMed Nml Nml Nml Nml Nml Nml Nml Nml Nml Nml Nml Nml N/A  Right Lumbo Parasp Low Nml Nml Nml Nml Nml Nml Nml Nml Nml Nml Nml Nml N/A  Right AdductorLong Nml Nml Nml Nml 2- Mod-V Nml Nml Nml Nml Nml Nml N/A  Left AntTibialis Nml Nml Nml Nml 2- Rapid Many 1+ Many 1+ Nml Nml N/A  Left RectFemoris Nml 1+ Nml Nml 2-  Mod-R Some 1+ Some 1+ Nml Nml N/A  Left Gastroc Nml Nml Nml Nml SMU Mod-R All 1+ Nml Nml Nml Nml N/A      Waveforms:

## 2015-02-25 ENCOUNTER — Telehealth: Payer: Self-pay | Admitting: Family Medicine

## 2015-02-25 DIAGNOSIS — G609 Hereditary and idiopathic neuropathy, unspecified: Secondary | ICD-10-CM

## 2015-02-25 NOTE — Telephone Encounter (Signed)
Nerve conduction test shows severe neuropathy in the legs. Dr. Posey Pronto neurologist recommends a follow-up consultation with them. Please inform patient. Please put in referral to Dr. Posey Pronto for follow-up on peripheral neuropathy and the abnormal nerve conduction tests.

## 2015-02-26 NOTE — Addendum Note (Signed)
Addended by: Jesusita Oka on: 02/26/2015 08:55 AM   Modules accepted: Orders

## 2015-02-26 NOTE — Telephone Encounter (Signed)
Results discussed with patient. Patient advised Nerve conduction test shows severe neuropathy in the legs. Dr. Posey Pronto neurologist recommends a follow-up consultation with them. Please put in referral to Dr. Posey Pronto for follow-up on peripheral neuropathy and the abnormal nerve conduction tests. Patient verbalized understanding. Referral ordered in EPIC.

## 2015-02-26 NOTE — Telephone Encounter (Signed)
[facility]

## 2015-03-02 ENCOUNTER — Encounter: Payer: Self-pay | Admitting: Gastroenterology

## 2015-03-09 ENCOUNTER — Other Ambulatory Visit: Payer: Self-pay | Admitting: Orthopedic Surgery

## 2015-03-09 DIAGNOSIS — M25512 Pain in left shoulder: Secondary | ICD-10-CM

## 2015-03-12 ENCOUNTER — Ambulatory Visit (INDEPENDENT_AMBULATORY_CARE_PROVIDER_SITE_OTHER): Payer: BLUE CROSS/BLUE SHIELD | Admitting: Family Medicine

## 2015-03-12 ENCOUNTER — Ambulatory Visit (INDEPENDENT_AMBULATORY_CARE_PROVIDER_SITE_OTHER): Payer: BLUE CROSS/BLUE SHIELD | Admitting: Neurology

## 2015-03-12 ENCOUNTER — Encounter: Payer: Self-pay | Admitting: Family Medicine

## 2015-03-12 ENCOUNTER — Inpatient Hospital Stay: Admission: RE | Admit: 2015-03-12 | Payer: BLUE CROSS/BLUE SHIELD | Source: Ambulatory Visit

## 2015-03-12 ENCOUNTER — Encounter: Payer: Self-pay | Admitting: Neurology

## 2015-03-12 ENCOUNTER — Other Ambulatory Visit (INDEPENDENT_AMBULATORY_CARE_PROVIDER_SITE_OTHER): Payer: BLUE CROSS/BLUE SHIELD

## 2015-03-12 ENCOUNTER — Ambulatory Visit
Admission: RE | Admit: 2015-03-12 | Discharge: 2015-03-12 | Disposition: A | Discharge status: Home / Self Care | Payer: BLUE CROSS/BLUE SHIELD | Source: Ambulatory Visit | Attending: Orthopedic Surgery | Admitting: Orthopedic Surgery

## 2015-03-12 VITALS — BP 128/84 | HR 92 | Ht 67.0 in | Wt 273.0 lb

## 2015-03-12 VITALS — BP 134/86 | Ht 67.0 in | Wt 275.0 lb

## 2015-03-12 DIAGNOSIS — Z23 Encounter for immunization: Secondary | ICD-10-CM | POA: Diagnosis not present

## 2015-03-12 DIAGNOSIS — G8220 Paraplegia, unspecified: Secondary | ICD-10-CM | POA: Diagnosis not present

## 2015-03-12 DIAGNOSIS — R7303 Prediabetes: Secondary | ICD-10-CM

## 2015-03-12 DIAGNOSIS — R278 Other lack of coordination: Secondary | ICD-10-CM

## 2015-03-12 DIAGNOSIS — G609 Hereditary and idiopathic neuropathy, unspecified: Secondary | ICD-10-CM

## 2015-03-12 DIAGNOSIS — R739 Hyperglycemia, unspecified: Secondary | ICD-10-CM | POA: Diagnosis not present

## 2015-03-12 DIAGNOSIS — M25512 Pain in left shoulder: Secondary | ICD-10-CM

## 2015-03-12 HISTORY — DX: Paraplegia, unspecified: G82.20

## 2015-03-12 LAB — C-REACTIVE PROTEIN: CRP: 0.2 mg/dL — AB (ref 0.5–20.0)

## 2015-03-12 LAB — SEDIMENTATION RATE: Sed Rate: 9 mm/hr (ref 0–22)

## 2015-03-12 LAB — POCT GLYCOSYLATED HEMOGLOBIN (HGB A1C): Hemoglobin A1C: 6.2

## 2015-03-12 LAB — CK: Total CK: 477 U/L — ABNORMAL HIGH (ref 7–232)

## 2015-03-12 NOTE — Progress Notes (Signed)
[facility] Clinic Note - Initial Visit   Date: 03/12/2015  Eddie Foley MRN: [id number] DOB: 1955-09-18   Dear Dr. Wolfgang Phoenix:  Thank you for your kind referral of Eddie Foley for consultation of neuropathy and leg weakness. Although his history is well known to you, please allow Eddie Foley to reiterate it for the purpose of our medical record. The patient was accompanied to the clinic by wife who also provides collateral information.     History of Present Illness: Eddie Foley is a 59 y.o. right-handed Caucasian male with GERD, OSA  presenting for evaluation of bilateral leg numbness and weakness.    Starting in 2006, he recalled waking up with sudden onset of bilateral feel numbness, which remained constant.  He was evaluated by neurologist who performed NCS/EMG and was told he had neuropathy (report not available).  Since 2015, the numbness gradually has involved the lower legs to the level of the knees.  He has associated burning sensation which is intermittent, but mostly he has constant numbness.  He feels that his feet are weak and has to take breaks from walking even 20 feet.  If he rests for a while, he can get back to walking.  He feels unsteady and stumbles frequently, and has fallen six times this year.  He suffered a fall last week while getting off the back of the truck and did not feel the rock beneath his foot and twist his ankle.  He walks unassisted.  He denies any numbness, tingling, or weakness of the hands.  He had severe cramps of the legs which has been ongoing for some time.  He takes magnesium and zinc supplements which helps.  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  He was seen by orthopeadics who did not recommend surgery.   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  He has noticed that he is unable to keep up walking with colleagues as well as even family.     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 Overall, these findings are severe in degree electrically.  Neurological consultation and additional electrodiagnostic testing of the upper extremities is recommended.  MRI lumbar spine 08/01/2014: Central to left posterior lateral disc herniation at L3-4 with a 1.3cm free fragment likely to cause neural compression on the left. Small right extra foraminal disc herniation at L2-3 which could have some potential to affect the exiting L2 nerve root. Chronic degenerative disc disease and degenerative facet disease at L4-5 and L5-S1 without definite nerve root compression.  Labs 02/21/2015:  B12 510, folate 7.9   Past Medical History  Diagnosis Date  . Hernia 9628    umbilical  . GERD (gastroesophageal reflux disease)   . Neuropathy (HCC)     legs  . Impaired fasting glucose   . Reactive airway disease     Past Surgical History  Procedure Laterality Date  . Cholecystectomy    . Hernia repair  3662    umbilical hernia  . Shoulder arthroscopy      left  Medications:  Outpatient Encounter Prescriptions as of 03/12/2015  Medication Sig  . famotidine (PEPCID) 20 MG tablet Take 20 mg by mouth daily.    Marland Kitchen NEXIUM 40 MG capsule TAKE ONE CAPSULE BY MOUTH ONCE DAILY  . OVER THE COUNTER MEDICATION Calcium, magnessium, and zinc. Takes one a day  . EPINEPHrine 0.3 mg/0.3 mL IJ SOAJ injection Inject 0.3 mLs (0.3 mg total) into the muscle once. (Patient not taking: Reported on 12/12/2014)   No facility-administered encounter  medications on file as of 03/12/2015.     Allergies:  Allergies  Allergen Reactions  . Bee Venom Anaphylaxis    Family History: Family History  Problem Relation Age of Onset  . Heart disease Mother   . Lung cancer Father     lung  . Prostate cancer Brother   . Heart disease Brother   . Diabetes Other   . Diabetes Other   . Healthy Son     x 3  . Healthy Daughter   . Neuropathy Neg Hx     Social History: Social History  Substance Use Topics  . Smoking status: Never Smoker   . Smokeless tobacco: None  . Alcohol Use: No   Social History   Social History Narrative   Lives in a [place] home.  He lives with wife and daughter.   He works as a Electrical engineer.   Highest level of education:  12th grade    Review of Systems:  CONSTITUTIONAL: No fevers, chills, night sweats, or weight loss.   EYES: No visual changes or eye pain ENT: No hearing changes.  No history of nose bleeds.   RESPIRATORY: No cough, wheezing +shortness of breath.   CARDIOVASCULAR: Negative for chest pain, and palpitations.   GI: Negative for abdominal discomfort, blood in stools or black stools.  No recent change in bowel habits.   GU:  No history of incontinence.   MUSCLOSKELETAL: No history of joint pain or swelling.  No myalgias.   SKIN: Negative for lesions, rash, and itching.   HEMATOLOGY/ONCOLOGY: Negative for prolonged bleeding, bruising easily, and swollen nodes.  No history of cancer.   ENDOCRINE: Negative for cold or heat intolerance, polydipsia or goiter.   PSYCH:  No depression or anxiety symptoms.   NEURO: As Above.   Vital Signs:  BP 128/84 mmHg  Pulse 92  Ht 5' 7"  (1.702 m)  Wt 273 lb (123.832 kg)  BMI 42.75 kg/m2   General Medical Exam:   General:  Well appearing, comfortable.   Eyes/ENT: see cranial nerve examination.   Neck: No masses appreciated.  Full range of motion without tenderness.  No carotid bruits. Respiratory:  Clear to auscultation, good air entry  bilaterally.   Cardiac:  Regular rate and rhythm, no murmur.   Extremities:  No deformities, edema, or skin discoloration.  Skin:  No rashes or lesions.  Neurological Exam: MENTAL STATUS including orientation to time, place, person, recent and remote memory, attention span and concentration, language, and fund of knowledge is normal.  Speech is not dysarthric.  CRANIAL NERVES: II:  No visual field defects.  Unremarkable fundi.   III-IV-VI: Pupils equal round and reactive to light.  Normal conjugate, extra-ocular eye movements in all directions of gaze.  No nystagmus.  No ptosis.   V:  Normal facial sensation.     VII:  Normal facial symmetry and movements.  No pathologic facial reflexes.  VIII:  Normal hearing and vestibular function.   IX-X:  Normal palatal movement.  XI:  Normal shoulder shrug and head rotation.   XII:  Normal tongue strength and range of motion, no deviation or fasciculation.  MOTOR:  Moderate atrophy of the TA bilaterally.  No fasciculations or abnormal movements.  No pronator drift.  Tone is normal.    Right Upper Extremity:    Left Upper Extremity:    Deltoid  5/5   Deltoid  5/5   Biceps  5/5   Biceps  5/5   Triceps  5/5   Triceps  5/5   Wrist extensors  5/5   Wrist extensors  5/5   Wrist flexors  5/5   Wrist flexors  5/5   Finger extensors  5/5   Finger extensors  5/5   Finger flexors  5/5   Finger flexors  5/5   Dorsal interossei  5/5   Dorsal interossei  5/5   Abductor pollicis  5/5   Abductor pollicis  5/5   Tone (Ashworth scale)  0  Tone (Ashworth scale)  0   Right Lower Extremity:    Left Lower Extremity:    Hip flexors  5-/5   Hip flexors  5-/5   Hip extensors  5-/5   Hip extensors  5-/5   Knee flexors  4+/5   Knee flexors  4/5   Knee extensors  4+/5   Knee extensors  4+/5   Dorsiflexors  4/5   Dorsiflexors  4/5   Plantarflexors  4/5   Plantarflexors  4/5   Toe extensors  3/5   Toe extensors  3-/5   Toe flexors  3/5   Toe flexors  2/5   Tone  (Ashworth scale)  0  Tone (Ashworth scale)  0   MSRs:  Right                                                                 Left brachioradialis 2+  brachioradialis 2+  biceps 1+  biceps 1+  triceps 1+  triceps 1+  patellar 0  patellar 0  ankle jerk 0  ankle jerk 0  Hoffman no  Hoffman no  plantar response mute  plantar response mute   SENSORY:  Vibration is reduced to 50% at the knees and absent at ankles, pin prick and temperature absent distal to knees bilaterally.  Impaired proprioception at the great toe.  Markedly positive Rhomberg testing.  COORDINATION/GAIT: Normal finger-to- nose-finger.  Intact rapid alternating movements bilaterally.  Gait is wide-based, slow, and cautious. He is unable to perform stressed or tandem gait.   IMPRESSION: Mr. Zeidman is a very pleasant 59 year-old gentleman presenting for evaluation of bilateral leg paresthesias and weakness.  He was diagnosed with neuropathy ~2006 with numbness restricted to the feet until 2015.  Over the past year, there has been gradually worsening numbness such that now it involves up to the knees, weakness of the feet, and gait difficulty.  NCS/EMG of the legs shows a severe active on chronic peripheral neuropathy but there was also evidence of active changes in the lumbar paraspinal muscles, so a polyradiculoneuropathy cannot be excluded.  I will schedule NCS/EMG of the upper extremities to assess the degree of involvement, as well as demyelinating changes which may favor polyradiculoneuropathy.   PLAN/RECOMMENDATIONS:  1.  Check 2-hr glucose tolerance  testing, ESR, CRP, zinc, copper,ceruloplasmin, CK, aldolase, SPEP/UPEP with IFE 2.  NCS/EMG of the arms 3.  Consider lumbar puncture based on the results of the above 4.  Recommend that he discuss with his workplace to transition into a more sedentary/desk job, as he is high fall risk in his current position 5.  For muscle cramps, he may try 2-3 glasses of tonic water  Further  recommendations to be based on the results of the above testing   The duration of this appointment visit was 60 minutes of face-to-face time with the patient.  Greater than 50% of this time was spent in counseling, explanation of diagnosis, planning of further management, and coordination of care.   Thank you for allowing me to participate in patient's care.  If I can answer any additional questions, I would be pleased to do so.    Sincerely,    Saidee Geremia K. Posey Pronto, DO

## 2015-03-12 NOTE — Patient Instructions (Addendum)
1.  NCS/EMG of the upper extremities 2.  Check blood work 3.  Due your your fall risk, I recommend that you discuss alternative options with your workplace 4.  Further recommendations will be communicated via phone  [facility]  Preventing Falls in the [place] are common, often dreaded events in the lives of older people. Aside from the obvious injuries and even death that may result, falls can cause wide-ranging consequences including loss of independence, mental decline, decreased activity, and mobility. Younger people are also at risk of falling, especially those with chronic illnesses and fatigue.  Ways to reduce the risk for falling:  * Examine diet and medications. Warm foods and alcohol dilate blood vessels, which can lead to dizziness when standing. Sleep aids, antidepressants, and pain medications can also increase the likelihood of a fall.  * Get a vison exam. Poor vision, cataracts, and glaucoma increase the chances of falling.  * Check foot gear. Shoes should fit snugly and have a sturdy, nonskid sole and broad, low heel.  * Participate in a physician-approved exercise program to build and maintain muscle strength and improve balance and coordination.  * Increase vitamin D intake. Vitamin D improves muscle strength and increases the amount of calcium the body is able to absorb and deposit in bones.  How to prevent falls from common hazards:  * Floors - Remove all loose wires, cords, and throw rugs. Minimize clutter. Make sure rugs are anchored and smooth. Keep furniture in its usual place.  * Chairs - Use chairs with straight backs, armrests, and firm seats. Add firm cushions to existing pieces to add height.  * Bathroom - Install grab bars and non-skid tape in the tub or shower. Use a bathtub transfer bench or a shower chair with a back support. Use an elevated toilet seat and/or safety rails to assist standing from a low surface. Do not use towel racks or bathroom tissue  holders to help you stand.  * Lighting - Make sure halls, stairways, and entrances are well-lit. Install a night light in your bathroom or hallway. Make sure there is a light switch at the top and bottom of the staircase. Turn lights on if you get up in the middle of the night. Make sure lamps or light switches are within reach of the bed if you have to get up during the night.  * Kitchen - Install non-skid rubber mats near the sink and stove. Clean spills immediately. Store frequently used utensils, pots, and pans between waist and eye level. This helps prevent reaching and bending. Sit when getting things out of the lower cupboards.  * Living room / [place] furniture with wide spaces in between, giving enough room to move around. Establish a route through the living room that gives you something to hold onto as you walk.  * Stairs - Make sure treads, rails, and rugs are secure. Install a rail on both sides of the stairs. If stairs are a threat, it might be helpful to arrange most of your activities on the lower level to reduce the number of times you must climb the stairs.  * Entrances and doorways - Install metal handles on the walls adjacent to the doorknobs of all doors to make it more secure as you travel through the doorway.  Tips for maintaining balance:  * Keep at least one hand free at all times Try using a backpack or fanny pack to hold things rather than carrying them  in your hands. Never carry objects in both hands when walking as this interferes with keeping your balance.  * Attempt to swing both arms from front to back while walking. This might require a conscious effort if Parkinson's disease has diminished your movement. It will, however, help you to maintain balance and posture, and reduce fatigue.  * Consciously lift your feet off the ground when walking. Shuffling and dragging of the feet is a common culprit in losing your balance.  * When trying to navigate turns, use a "U"  technique of facing forward and making a wide turn, rather than pivoting sharply.  * Try to stand with your feet shoulder-length apart. When your feet are close together for any length of time, you increase your risk of losing your balance and falling.  * Do one thing at a time. Do not try to walk and accomplish another task, such as reading or looking around. The decrease in your automatic reflexes complicates motor function, so the less distraction, the better.  * Do not wear rubber or gripping soled shoes, they might "catch" on the floor and cause tripping.  * Move slowly when changing positions. Use deliberate, concentrated movements and, if needed, use a grab bar or walking aid. Count fifteen (15) seconds after standing to begin walking.  * If balance is a continuous problem, you might want to consider a walking aid such as a cane, walking stick, or walker. Once you have mastered walking with help, you may be ready to try it again on your own.  This information is provided by [facility] and is not intended to replace the medical advice of your physician or other health care providers. Please consult your physician or other health care providers for advice regarding your specific medical condition.

## 2015-03-12 NOTE — Progress Notes (Signed)
   Subjective:    Patient ID: Eddie Foley, male    DOB: [birth year], 59 y.o.   MRN: [id number]  HPIFollow up on neuropathy. Saw specialist this am Dr. Posey Pronto. Long discussion held with the patient regarding safety regarding the risk of falling in addition to this discussing his work. Consideration for disability discussed in detail as well patient states he is tolerating pain does not want any narcotic pain medicine. Patient does relate that he is trying to watch his diet try to lose some weight. We went over his lab work including lipid, metabolic 7, hyperglycemia PSA thyroid B12 folate all of this was discussed in detail. Findings from neurologist discussed in detail. Greater and 25 minutes spent on all these issues.  Shoulder pain. [facility] [place] Dr. French Ana on Monday.   Pt states no other concerns or problems today.   Flu vaccine today.     Review of Systems     Objective:   Physical Exam  Lungs are clear hearts regular pulse normal dense neuropathy in both legs instability with walking has to hold onto things  25 minutes was spent with the patient. Greater than half the time was spent in discussion and answering questions and counseling regarding the issues that the patient came in for today.     Assessment & Plan:  #1 severe polyneuropathy-patient is at risk of falling I advised him not to walk on roofs not to walk on any surface for which a substantial fall could occur. He was advised to use a cane to help him around. He was also advised to install hand rails in other items within the house to minimize the risk of falling. Patient undergoing more testing from neurology I've advised the patient and he may need to file for disability. Patient states that he does not feel that he be able to train himself into another feel at his age  #2 shoulder pain MRI later today seeing orthopedics on Monday  New onset prediabetes patient's had hyperglycemia in the  past. I would advise consideration for metformin patient would like to try aggressive diet along with exercise in order to try to bring this under control. He will follow-up in 3-4 months to recheck A1c if not having significant improvement he will start metformin  Patient's LDL looks good triglycerides slightly elevated watch diet closely PSA-B12-folate-thyroid all looks good  If patient decides to file for disability they will let us know. We will be supportive of this. Hard to know for certain at this present moment.  We will send a copy of this to neurology so that they will be aware of [name] following his prediabetes.

## 2015-03-14 ENCOUNTER — Ambulatory Visit
Admission: RE | Admit: 2015-03-14 | Discharge: 2015-03-14 | Disposition: A | Discharge status: Home / Self Care | Payer: BLUE CROSS/BLUE SHIELD | Source: Ambulatory Visit | Attending: Orthopedic Surgery | Admitting: Orthopedic Surgery

## 2015-03-14 LAB — ALDOLASE: Aldolase: 10.4 U/L — ABNORMAL HIGH (ref <=8.1)

## 2015-03-16 LAB — COPPER, SERUM: COPPER: 87 ug/dL (ref 70–175)

## 2015-03-16 LAB — SPEP & IFE WITH QIG
ALPHA-2-GLOBULIN: 0.8 g/dL (ref 0.5–0.9)
Albumin ELP: 3.7 g/dL — ABNORMAL LOW (ref 3.8–4.8)
Alpha-1-Globulin: 0.3 g/dL (ref 0.2–0.3)
BETA GLOBULIN: 0.4 g/dL (ref 0.4–0.6)
Beta 2: 0.4 g/dL (ref 0.2–0.5)
Gamma Globulin: 0.7 g/dL — ABNORMAL LOW (ref 0.8–1.7)
IgA: 134 mg/dL (ref 68–379)
IgG (Immunoglobin G), Serum: 746 mg/dL (ref 650–1600)
IgM, Serum: 76 mg/dL (ref 41–251)
Total Protein, Serum Electrophoresis: 6.3 g/dL (ref 6.1–8.1)

## 2015-03-16 LAB — UIFE/LIGHT CHAINS/TP QN, 24-HR UR
ALBUMIN, U: DETECTED
Alpha 1, Urine: DETECTED — AB
Alpha 2, Urine: DETECTED — AB
Beta, Urine: DETECTED — AB
Gamma Globulin, Urine: DETECTED — AB
Total Protein, Urine: 15 mg/dL (ref 5–25)

## 2015-03-16 LAB — ZINC: ZINC: 75 ug/dL (ref 60–130)

## 2015-03-16 LAB — CERULOPLASMIN: Ceruloplasmin: 23 mg/dL (ref 18–36)

## 2015-03-24 ENCOUNTER — Ambulatory Visit (INDEPENDENT_AMBULATORY_CARE_PROVIDER_SITE_OTHER): Payer: BLUE CROSS/BLUE SHIELD | Admitting: Neurology

## 2015-03-24 ENCOUNTER — Other Ambulatory Visit: Payer: Self-pay | Admitting: *Deleted

## 2015-03-24 DIAGNOSIS — G609 Hereditary and idiopathic neuropathy, unspecified: Secondary | ICD-10-CM

## 2015-03-24 DIAGNOSIS — G8220 Paraplegia, unspecified: Secondary | ICD-10-CM

## 2015-03-24 DIAGNOSIS — R278 Other lack of coordination: Secondary | ICD-10-CM

## 2015-03-24 NOTE — Procedures (Signed)
[facility]  [address], [address]  [place], [place] [address] Tel: [phone] Fax:  [phone] Test Date:  03/24/2015  Patient: Eddie Foley DOB: [birth year] Physician: Narda Amber  Sex: Male Height: 5\' 7"  Ref Phys: Narda Amber  ID#: [id number] Temp: 36.3C Technician: Jerilynn Mages. Dean   Patient Complaints: This is 59 year old gentleman referred for evaluation of bilateral hand paresthesias and weakness.  NCV & EMG Findings: Extensive electrodiagnostic testing of the right upper extremity and additional studies of the left shows: 1. Bilateral median sensory responses are markedly reduced in amplitude. Bilateral ulnar and radial sensory responses are absent. 2. Bilateral ulnar motor responses show normal amplitude and latency, however there is conduction velocity slowing across the elbow (A Elbow-B Elbow, R40, L48 m/s).  Bilateral median motor responses are within normal limits. 3. Chronic motor axon loss changes are seen affecting the first dorsal interosseous and extensor indicis proprius muscles bilaterally, without accompanied active denervation.  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 Overall, these findings are severe in degree electrically with respect to the sensory nerves.   _____________________________ Narda Amber, D.O.    Nerve Conduction Studies Anti Sensory Summary Table   Stim Site NR Peak (ms) Norm Peak (ms) P-T Amp (V) Norm P-T Amp  Left Median Anti Sensory (2nd Digit)  36.3C  Wrist    3.6 <3.7 3.5 >20  Right Median Anti Sensory (2nd Digit)  36.3C  Wrist    3.6 <3.7 2.9 >20  Left Radial Anti Sensory (Base 1st Digit)  36.3C  Wrist NR  <2.7  >20  Right Radial Anti Sensory (Base 1st Digit)  36.3C  Wrist NR  <2.7  >20  Left Ulnar Anti Sensory (5th Digit)  36.3C  Wrist NR  <3.5  >10  Right Ulnar Anti Sensory  (5th Digit)  36.3C  Wrist NR  <3.5  >10   Motor Summary Table   Stim Site NR Onset (ms) Norm Onset (ms) O-P Amp (mV) Norm O-P Amp Site1 Site2 Delta-0 (ms) Dist (cm) Vel (m/s) Norm Vel (m/s)  Left Median Motor (Abd Poll Brev)  36.3C  Wrist    4.1 <4.4 9.5 >4 Elbow Wrist 4.2 21.0 50 >49  Elbow    8.3  9.0         Right Median Motor (Abd Poll Brev)  36.3C  Wrist    4.2 <4.4 9.7 >4 Elbow Wrist 4.7 23.0 49 >49  Elbow    8.9  8.8         Left Ulnar Motor (Abd Dig Minimi)  36.3C  Wrist    3.2 <3.5 7.4 >6 B Elbow Wrist 3.9 23.0 59 >49  B Elbow    7.1  7.4  A Elbow B Elbow 2.1 10.0 48 >49  A Elbow    9.2  7.1         Right Ulnar Motor (Abd Dig Minimi)  36.3C  Wrist    3.1 <3.5 8.6 >6 B Elbow Wrist 4.0 22.0 55 >49  B Elbow    7.1  6.8  A Elbow B Elbow 2.5 10.0 40 >49  A Elbow    9.6  6.2          F Wave Studies   NR F-Lat (ms) Lat Norm (ms) L-R F-Lat (ms)  Left Ulnar (Mrkrs) (Abd Dig Min)  36.3C     30.39 <33 1.25  Right Ulnar (Mrkrs) (Abd Dig  Min)  36.3C     29.14 <33 1.25   EMG   Side Muscle Ins Act Fibs Psw Fasc Number Recrt Dur Dur. Amp Amp. Poly Poly. Comment  Right 1stDorInt Nml Nml Nml Nml 1- Mod-R Few 1+ Few 1+ Nml Nml N/A  Right Abd Poll Brev Nml Nml Nml Nml Nml Nml Nml Nml Nml Nml Nml Nml N/A  Right Ext Indicis Nml Nml Nml Nml 1- Rapid Few 1+ Few 1+ Nml Nml N/A  Right PronatorTeres Nml Nml Nml Nml Nml Nml Nml Nml Nml Nml Nml Nml N/A  Right Biceps Nml Nml Nml Nml Nml Nml Nml Nml Nml Nml Nml Nml N/A  Right Triceps Nml Nml Nml Nml Nml Nml Nml Nml Nml Nml Nml Nml N/A  Right Deltoid Nml Nml Nml Nml Nml Nml Nml Nml Nml Nml Nml Nml N/A  Left 1stDorInt Nml Nml Nml Nml 1- Rapid Some 1+ Some 1+ Nml Nml N/A  Left Ext Indicis Nml Nml Nml Nml 1- Rapid Some 1+ Nml Nml Nml Nml N/A  Left PronatorTeres Nml Nml Nml Nml Nml Nml Nml Nml Nml Nml Nml Nml N/A  Left Biceps Nml Nml Nml Nml Nml Nml Nml Nml Nml Nml Nml Nml N/A  Left Triceps Nml Nml Nml Nml Nml Nml Nml Nml Nml Nml Nml Nml N/A    Left Deltoid Nml Nml Nml Nml Nml Nml Nml Nml Nml Nml Nml Nml N/A  Left FlexDigProf 4,5 Nml Nml Nml Nml Nml Nml Nml Nml Nml Nml Nml Nml N/A      Waveforms:

## 2015-03-28 ENCOUNTER — Other Ambulatory Visit: Payer: Self-pay | Admitting: Neurology

## 2015-03-29 LAB — GLUCOSE TOLERANCE (3 SP BLOOD)
GLUCOSE 1 HOUR GTT: 232 mg/dL — AB (ref 65–199)
GLUCOSE FASTING GTT: 108 mg/dL — AB (ref 65–99)
GLUCOSE, 2 HOUR: 191 mg/dL — AB (ref 65–139)

## 2015-03-31 ENCOUNTER — Telehealth: Payer: Self-pay | Admitting: Neurology

## 2015-03-31 MED ORDER — GABAPENTIN 300 MG PO CAPS: ORAL_CAPSULE | ORAL | Status: DC

## 2015-03-31 NOTE — Telephone Encounter (Signed)
The patient failed glucose tolerance testing recent A1c also 6.2 nurses please discuss with patient and/or wife. Neurologist communicated with [name] their concern regarding sugar and how this is impacting his health so therefore I would recommend metformin 500 mg half tablet twice daily with meals. I did recommend 30 tablets 3 refills I also recommend standard follow-up office visit within a couple months to recheck the A1c.-Needs to be 3 months away from previous A1c-please let family know most people get along well with the medication. Sometimes can cause abdominal cramps or diarrhea if any problems with medicine let me know if they would prefer to come back and sit down and discuss further please set them up somewhere within the next couple weeks to do so thank you

## 2015-03-31 NOTE — Telephone Encounter (Signed)
Called and spoke to patient's wife who was present at his previous visits and discussed that his labs do indicate impaired glucose tolerance and show signs of diabetes.  I will share these results with his PCP and to determine additional recommendations, in the meantime I suggested a low-sugar, low-carbohydrate diet, especially as diabetes can cause polyradiculoneuropathy.   His neuropathic pain is worsening and preventing him from getting rest at nighttime, so they are agreeable to starting gabapentin 300mg  and increase to 300mg  BID after a week.  CSF testing is scheduled for later this week.  He is aware that results may return slowly over 1-2 weeks and that I am out of the office most of next month, but will go over results upon my return.    Keasia Dubose K. Posey Pronto, DO

## 2015-04-01 ENCOUNTER — Other Ambulatory Visit: Payer: Self-pay | Admitting: *Deleted

## 2015-04-01 MED ORDER — METFORMIN HCL 500 MG PO TABS: ORAL_TABLET | ORAL | Status: DC

## 2015-04-01 NOTE — Telephone Encounter (Signed)
Discussed with pt. Pt wants to start med. Med sent to pharm. Pt transferred to front to schedule ov in 3 months. Pt verbalized understanding.

## 2015-04-03 ENCOUNTER — Other Ambulatory Visit (HOSPITAL_COMMUNITY)
Admission: RE | Admit: 2015-04-03 | Discharge: 2015-04-03 | Disposition: A | Discharge status: Home / Self Care | Payer: BLUE CROSS/BLUE SHIELD | Source: Ambulatory Visit | Attending: Neurology | Admitting: Neurology

## 2015-04-03 ENCOUNTER — Other Ambulatory Visit: Payer: Self-pay | Admitting: Neurology

## 2015-04-03 ENCOUNTER — Ambulatory Visit
Admission: RE | Admit: 2015-04-03 | Discharge: 2015-04-03 | Disposition: A | Discharge status: Home / Self Care | Payer: BLUE CROSS/BLUE SHIELD | Source: Ambulatory Visit | Attending: Neurology | Admitting: Neurology

## 2015-04-03 DIAGNOSIS — G609 Hereditary and idiopathic neuropathy, unspecified: Secondary | ICD-10-CM

## 2015-04-03 DIAGNOSIS — R278 Other lack of coordination: Secondary | ICD-10-CM

## 2015-04-03 DIAGNOSIS — G8220 Paraplegia, unspecified: Secondary | ICD-10-CM

## 2015-04-03 LAB — CSF CELL COUNT WITH DIFFERENTIAL
RBC Count, CSF: 0 cu mm
TUBE #: 3
WBC, CSF: 2 cu mm (ref 0–5)

## 2015-04-03 LAB — PROTEIN, CSF: TOTAL PROTEIN, CSF: 37 mg/dL (ref 15–45)

## 2015-04-03 LAB — GLUCOSE, CSF: GLUCOSE CSF: 75 mg/dL (ref 43–76)

## 2015-04-03 NOTE — Progress Notes (Signed)
1SST tube drawn from left AC. Discharge instructions explained to pt and his wife

## 2015-04-03 NOTE — Discharge Instructions (Signed)

## 2015-04-06 ENCOUNTER — Encounter: Payer: BLUE CROSS/BLUE SHIELD | Admitting: Neurology

## 2015-04-06 LAB — CNS IGG SYNTHESIS RATE, CSF+BLOOD
Albumin, CSF: 18.8 mg/dL (ref 8.0–42.0)
Albumin, Serum(Neph): 3.7 g/dL (ref 3.2–4.6)
IGG INDEX, CSF: 0.46 (ref <0.66)
IGG, CSF: 1.4 mg/dL (ref 0.8–7.7)
IGG, SERUM: 597 mg/dL — AB (ref 694–1618)
MS CNS IGG SYNTHESIS RATE: -2 mg/(24.h) (ref <=3.3)

## 2015-04-06 LAB — CSF CULTURE W GRAM STAIN

## 2015-04-06 LAB — CSF CULTURE
GRAM STAIN: NONE SEEN
ORGANISM ID, BACTERIA: NO GROWTH

## 2015-04-09 LAB — MYELIN BASIC PROTEIN, CSF: Myelin Basic Protein: <2 mcg/L (ref 0.0–4.0)

## 2015-04-09 LAB — OLIGOCLONAL BANDS, CSF + SERM

## 2015-04-10 ENCOUNTER — Telehealth: Payer: Self-pay | Admitting: Family Medicine

## 2015-04-10 MED ORDER — METFORMIN HCL 500 MG PO TABS: ORAL_TABLET | ORAL | Status: DC

## 2015-04-10 NOTE — Telephone Encounter (Signed)
Rx resent electronically to pharmacy.

## 2015-04-10 NOTE — Telephone Encounter (Signed)
metFORMIN (GLUCOPHAGE) 500 MG tablet  Can we resend this to [facility] please  They went today to pick it up but nothing is there

## 2015-04-24 ENCOUNTER — Encounter: Payer: Self-pay | Admitting: Neurology

## 2015-04-24 ENCOUNTER — Ambulatory Visit (INDEPENDENT_AMBULATORY_CARE_PROVIDER_SITE_OTHER): Payer: BLUE CROSS/BLUE SHIELD | Admitting: Neurology

## 2015-04-24 VITALS — BP 136/88 | HR 78 | Ht 67.0 in | Wt 268.0 lb

## 2015-04-24 DIAGNOSIS — G609 Hereditary and idiopathic neuropathy, unspecified: Secondary | ICD-10-CM | POA: Diagnosis not present

## 2015-04-24 DIAGNOSIS — R278 Other lack of coordination: Secondary | ICD-10-CM | POA: Diagnosis not present

## 2015-04-24 DIAGNOSIS — G8220 Paraplegia, unspecified: Secondary | ICD-10-CM

## 2015-04-24 NOTE — Progress Notes (Signed)
Follow-up Visit   Date: 04/24/2015   Eddie Foley MRN: [id number] DOB: July 10, 1955   Interim History: Eddie Foley is a 59 y.o. right-handed Caucasian male with GERD and OSA returning to the clinic for follow-up of peripheral neuropathy.  The patient was accompanied to the clinic by wife who also provides collateral information.    History of present illness: Starting in 2006, he recalled waking up with sudden onset of bilateral feel numbness, which remained constant. He was evaluated by neurologist who performed NCS/EMG and was told he had neuropathy (report not available). Since 2015, the numbness gradually has involved the lower legs to the level of the knees. He has associated burning sensation which is intermittent, but mostly he has constant numbness. He feels that his feet are weak and has to take breaks from walking even 20 feet. If he rests for a while, he can get back to walking. He feels unsteady and stumbles frequently, and has fallen six times this year. He suffered a fall last week while getting off the back of the truck and did not feel the rock beneath his foot and twist his ankle. He walks unassisted.  He denies any numbness, tingling, or weakness of the hands. He had severe cramps of the legs which has been ongoing for some time. He takes magnesium and zinc supplements which helps.  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 He was seen by orthopeadics who did not recommend surgery.   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 He has noticed that he is unable to keep up walking with colleagues as well as even family.    UPDATE 04/24/2015:  He underwent CSF testing which has returned normal, except 3 OCB presents in serum and CSF.  Labs thus far do  not show signs of inflammation.  He continues to have weakness of the legs and numbness/tingling.  No interval falls, but he has been tripping frequently.  There is mild weakness of the hands, especially with grasping objects.  Painful paresthesias have improved with gabapentin 341m at bedtime.  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  Medications:  Current Outpatient Prescriptions on File Prior to Visit  Medication Sig Dispense Refill  . famotidine (PEPCID) 20 MG tablet Take 20 mg by mouth daily.      .Marland Kitchengabapentin (NEURONTIN) 300 MG capsule Take one tablet at bedtime for one week, then increase to one tablet twice daily. (Patient taking differently: Take 300 mg by mouth at bedtime. ) 60 capsule 5  . metFORMIN (GLUCOPHAGE) 500 MG tablet Take one half tablets twice daily with meals. 30 tablet 2  . NEXIUM 40 MG capsule TAKE ONE CAPSULE BY MOUTH ONCE DAILY 14 capsule 0  . OVER THE COUNTER MEDICATION Calcium, magnessium, and zinc. Takes one a day    . EPINEPHrine 0.3 mg/0.3 mL IJ SOAJ injection Inject 0.3 mLs (0.3 mg total) into the muscle once. (Patient not taking: Reported on 04/24/2015) 2 Device 2   No current facility-administered medications on file prior to visit.    Allergies:  Allergies  Allergen Reactions  . Bee Venom Anaphylaxis    Review of Systems:  CONSTITUTIONAL: No fevers, chills, night sweats, or weight loss.  EYES:  No visual changes or eye pain ENT: No hearing changes.  No history of nose bleeds.   RESPIRATORY: No cough, wheezing and shortness of breath.   CARDIOVASCULAR: Negative for chest pain, and palpitations.   GI: Negative for abdominal discomfort, blood in stools or black stools.  No recent change in bowel habits.   GU:  No history of incontinence.   MUSCLOSKELETAL: No history of joint pain or swelling.  No myalgias.   SKIN: Negative for lesions, rash, and itching.   ENDOCRINE: Negative for cold or heat intolerance,  polydipsia or goiter.   PSYCH:  No depression or anxiety symptoms.   NEURO: As Above.   Vital Signs:  BP 136/88 mmHg  Pulse 78  Ht _0  (1.702 m)  Wt 268 lb (121.564 kg)  BMI 41.96 kg/m2 Neurological Exam: MENTAL STATUS including orientation to time, place, person, recent and remote memory, attention span and concentration, language, and fund of knowledge is normal.  Speech is not dysarthric.  CRANIAL NERVES:  Pupils equal round and reactive to light.  Normal conjugate, extra-ocular eye movements in all directions of gaze.  Face is symmetric.   MOTOR: Moderate atrophy of the TA bilaterally. No fasciculations or abnormal movements. No pronator drift. Tone is normal.   Right Upper Extremity:    Left Upper Extremity:    Deltoid  5/5   Deltoid  5/5   Biceps  5/5   Biceps  5/5   Triceps  5/5   Triceps  5/5   Wrist extensors  5/5   Wrist extensors  5/5   Wrist flexors  5/5   Wrist flexors  5/5   Finger extensors  5/5   Finger extensors  5/5   Finger flexors  5/5   Finger flexors  5/5   Dorsal interossei  5-/5   Dorsal interossei  5-/5   Abductor pollicis  5/5   Abductor pollicis  5/5   Tone (Ashworth scale)  0  Tone (Ashworth scale)  0   Right Lower Extremity:    Left Lower Extremity:    Hip flexors  5-/5   Hip flexors  5-/5   Hip extensors  5-/5   Hip extensors  5-/5   Knee flexors  4+/5   Knee flexors  4/5   Knee extensors  4+/5   Knee extensors  4+/5   Dorsiflexors  4/5   Dorsiflexors  4/5   Plantarflexors  4/5   Plantarflexors  4/5   Toe extensors  3/5   Toe extensors  3-/5   Toe flexors  3/5   Toe flexors  2/5   Tone (Ashworth scale)  0  Tone (Ashworth scale)  0   MSRs: Right Left brachioradialis 2+  brachioradialis 2+  biceps 1+  biceps 1+  triceps 1+  triceps 1+  patellar 0   patellar 0  ankle jerk 0  ankle jerk 0   SENSORY: Vibration is reduced to 50% at the knees and absent at ankles, pin prick and temperature absent distal to knees bilaterally. Impaired proprioception at the great toe. Markedly positive Rhomberg testing.  COORDINATION/GAIT: Normal finger-to- nose-finger. Intact rapid alternating movements bilaterally. Gait is wide-based, slow, and cautious. He is able to stand to rise without using arms.        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 Overall, these findings are severe in degree  electrically with respect to the sensory nerves.  NCS/EMG of the legs 02/24/2015: The electrophysiologic findings are most consistent with an active on chronic sensorimotor polyneuropathy, predominantly axon loss in type, affecting the lower extremities. Overall, these findings are severe in degree electrically.  Neurological consultation and additional electrodiagnostic testing of the upper extremities is recommended.  MRI lumbar spine 08/01/2014: Central to left posterior lateral disc herniation at L3-4 with a 1.3cm free fragment likely to cause neural compression on the left. Small right extra foraminal disc herniation at L2-3 which could have some potential to affect the exiting L2 nerve root. Chronic degenerative disc disease and degenerative facet disease at L4-5 and L5-S1 without definite nerve root compression.  Labs 02/21/2015: B12 510, folate 7.9, TSH 2.620 Labs 03/12/2015:  2-hr glucose tolerance testing positive [id number], ESR 9, CRP 0.2, zinc 75, copper 87,ceruloplasmin 23, CK 477*, aldolase 10.4*, SPEP/UPEP with IFE No M protein  CSF 04/03/2015:  W2  R0  G75  P37 , OCB 3 bands present in the CSF and serum, MBP > 2.0, IgG index 0.46   IMPRESSION/PLAN: Mr. Chittum is a  very pleasant 59 year-old gentleman returning for evaluation of bilateral leg paresthesias and weakness. He was diagnosed with neuropathy ~2006 with numbness restricted to the feet until 2015. Over the past year, there has been gradually worsening numbness such that now it involves up to the knees, weakness of the feet, and gait difficulty. NCS/EMG of the legs shows a severe active on chronic peripheral neuropathy but there was also evidence of active changes in the lumbar paraspinal muscles, so a polyradiculoneuropathy cannot be excluded. NCS of the upper extremities consider predominately sensory polyneuropathy without demyelinating features.  There is no evidence of albumincytologic dissociation on CSF testing nor signs of inflammation.  At this juncture, his findings are most consistent with a severe active on chronic peripheral neuropathy.  Etiology remains uncertain so I will send laboratory testing for other uncommon causes of neuropathy including autoimmune, inflammatory, infiltrative, and nutrient deficiencies.   PLAN/RECOMMENDATIONS:  1.  Check vit B6, B1, vitamin E, celiac's, RF, ANA, cryoglobulins, ANCA panel, lyme, HIV, heavy metal screen (mercury, lead, arsenic for basic), ACE, SSA/B 2.  Follow-up with CSF studies 3.  Work restrictions recommended due to safety reasons 4.  PT declined, but I strongly recommended this and especially AFO  Return to clinic in 1-2 months    The duration of this appointment visit was 35 minutes of face-to-face time with the patient.  Greater than 50% of this time was spent in counseling, explanation of diagnosis, planning of further management, and coordination of care.   Thank you for allowing me to participate in patient's care.  If I can answer any additional questions, I would be pleased to do so.    Sincerely,    Leslieann Whisman K. Posey Pronto, DO

## 2015-04-24 NOTE — Patient Instructions (Addendum)
1.  Check labs.  We will call you with the results 2.  Work restrictions are recommended 3.  Please call my office to schedule physical therapy   Return to clinic in 1-2 months

## 2015-04-25 LAB — HIV ANTIBODY (ROUTINE TESTING W REFLEX): HIV 1&2 Ab, 4th Generation: NONREACTIVE

## 2015-04-28 LAB — HEAVY METALS, BLOOD: Arsenic: 3 mcg/L (ref <23)

## 2015-04-28 LAB — CELIAC PANEL 10
ENDOMYSIAL SCREEN: NEGATIVE
GLIADIN IGG: 2 U (ref <20)
Gliadin IgA: 3 Units (ref <20)
IGA: 119 mg/dL (ref 68–379)
TISSUE TRANSGLUTAMINASE AB, IGA: 1 U/mL (ref <4)
Tissue Transglut Ab: 1 U/mL (ref <6)

## 2015-04-28 LAB — ANA: Anti Nuclear Antibody(ANA): NEGATIVE

## 2015-04-28 LAB — SJOGREN'S SYNDROME ANTIBODS(SSA + SSB)
SSA (RO) (ENA) ANTIBODY, IGG: NEGATIVE
SSB (La) (ENA) Antibody, IgG: <1

## 2015-04-28 LAB — ANCA SCREEN W REFLEX TITER: ANCA Screen: NEGATIVE

## 2015-04-28 LAB — LYME AB/WESTERN BLOT REFLEX: B burgdorferi Ab IgG+IgM: 0.26 {ISR}

## 2015-04-28 LAB — ANGIOTENSIN CONVERTING ENZYME: ANGIOTENSIN-CONVERTING ENZYME: 61 U/L — AB (ref 8–52)

## 2015-04-30 LAB — VITAMIN E
GAMMA-TOCOPHEROL (VIT E): 1.5 mg/L (ref <=4.3)
Vitamin E (Alpha Tocopherol): 9.3 mg/L (ref 5.7–19.9)

## 2015-05-01 LAB — VITAMIN B6: Vitamin B6: 8.2 ng/mL (ref 2.1–21.7)

## 2015-05-01 LAB — CRYOGLOBULIN

## 2015-05-02 LAB — VITAMIN B1: Vitamin B1 (Thiamine): 16 nmol/L (ref 8–30)

## 2015-05-08 ENCOUNTER — Encounter: Payer: Self-pay | Admitting: Nutrition

## 2015-05-08 ENCOUNTER — Telehealth: Payer: Self-pay | Admitting: Neurology

## 2015-05-08 ENCOUNTER — Encounter: Payer: BLUE CROSS/BLUE SHIELD | Attending: Family Medicine | Admitting: Nutrition

## 2015-05-08 VITALS — Ht 68.0 in | Wt 272.0 lb

## 2015-05-08 DIAGNOSIS — R739 Hyperglycemia, unspecified: Secondary | ICD-10-CM

## 2015-05-08 DIAGNOSIS — R7303 Prediabetes: Secondary | ICD-10-CM | POA: Insufficient documentation

## 2015-05-08 NOTE — Patient Instructions (Signed)
Goals:  1. Follow My Plate Method 2. Cut out sodas, juices and tea. 3. Drink only water. 4. Cut out snacks between meals. 5. Increase fresh fruits and vegetables. 6. Avoid processed foods and choose lower fat low sodium food choices when available. 7. Walk 30 minutes 3-5 times per week. 8. Lose 1-2 lbs per week. 9. Get A1C to 5.7% or less in three months

## 2015-05-08 NOTE — Telephone Encounter (Signed)
I attempted to contact patient via phone today regarding the results of labs, however there was no answer so a message was left for the patient to return my call.

## 2015-05-08 NOTE — Progress Notes (Signed)
  Medical Nutrition Therapy:  Appt start time: 1330 end time:  1430.  Assessment:  Primary concerns today: Prediabetes. Lives with wife and daughter. Most foods are cooked by his wife and are baked and fired. 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 He complains of chronic fatigue, increased thirst and frequent urination and lack of energy. Denies depression. Physical activity ADL. Not physically active right now. Willing to change his eating habits. His most recent A1C was 6.2% at PCP visit. Says he has a lot of neuropathy and chronic leg pain.  Diet is excessive in carbohydrates, mainly from simple sugars of soda, and fat and sodium as well as low in fresh fruits and vegetables and whole grains. He doesn't drink much water and only drinks sodas primarily. His current diet may be contributing to chronic pain from processed foods effecting inflammation of joints and tissues. Improved diet and weight loss will help him significantly. He appears to be motivated to make behavior and lifestyle changes.  Preferred Learning Style:   No preference indicated   Learning Readiness:  Ready  Change in progress   MEDICATIONS: See list   DIETARY INTAKE:  24-hr recall:  B ( AM):  Honey bunches of oats with almonds;  2% milk, and whole banana; OR Bacon egg and cheese or chicken biscuits, Soda or water Snk ( AM): nabs and sodas  L ( PM): Cheeseburger, fries and drink Snk ( PM): none D ( PM): chili beans 2 cups, 10-12 Crackers and Soda Snk ( PM):  Ice cream or  popcorn Beverages: soda, water-3 to 4 bottles per day.  Usual physical activity: ADL  Estimated energy needs: 1800 calories 200 g carbohydrates 135 g protein 50 g fat  Progress Towards Goal(s):  In progress.   Nutritional Diagnosis:  NB-1.1 Food and nutrition-related knowledge deficit As related to Prediabets..  As evidenced by A1C  6.2%..    Intervention:  Nutrition and Diabetes education provided on My Plate, CHO counting, meal planning, portion sizes, timing of meals, avoiding snacks between meals unless having a low blood sugar, target ranges for A1C and blood sugars, signs/symptoms and treatment of hyper/hypoglycemia, monitoring blood sugars, taking medications as prescribed, benefits of exercising 30 minutes per day and prevention of complications of DM. Low Sodium Low Fat Fiber Diet.  Goals:  1. Follow My Plate Method 2. Cut out sodas, juices and tea. 3. Drink only water. 4. Cut out snacks between meals. 5. Increase fresh fruits and vegetables. 6. Avoid processed foods and choose lower fat low sodium food choices when available. 7. Walk 30 minutes 3-5 times per week. 8. Lose 1-2 lbs per week. 9. Get A1C to 5.7% or less in three months.  Teaching Method Utilized:  Visual Auditory Hands on  Handouts given during visit include:  The Plate Method  Meal Plan Card  Diabetes Instructions.   Barriers to learning/adherence to lifestyle change: None  Demonstrated degree of understanding via:  Teach Back   Monitoring/Evaluation:  Dietary intake, exercise, meal planning, and body weight 1  Month.

## 2015-05-12 ENCOUNTER — Telehealth: Payer: Self-pay | Admitting: Neurology

## 2015-05-12 NOTE — Telephone Encounter (Signed)
Called and discuss the results of his laboratory testing which shows mild elevation in ACE levels of 61 (normal 8-52).  He endorses chronic cough and shortness of breath and has not been evaluated for this recently.  I offered either repeating ACE levels as this can be nonspecifically elevated sometimes or move forward with CT chest wwo contrast.  He would like to discuss with his wife and call us back.  Labs 04/24/2015:  Vit B6 8.2, B1 16, vitamin E 1.5, celiac's neg, ANA neg, cryoglobulins neg, ANCA panel neg, lyme neg, HIV neg, heavy metal screen (mercury, lead, arsenic for basic) neg, SSA/B neg, ACE 61*  Kaysea Raya K. Posey Pronto, DO

## 2015-05-13 ENCOUNTER — Other Ambulatory Visit: Payer: Self-pay | Admitting: *Deleted

## 2015-05-13 DIAGNOSIS — R278 Other lack of coordination: Secondary | ICD-10-CM

## 2015-05-13 DIAGNOSIS — G609 Hereditary and idiopathic neuropathy, unspecified: Secondary | ICD-10-CM

## 2015-05-13 DIAGNOSIS — R0600 Dyspnea, unspecified: Secondary | ICD-10-CM

## 2015-05-13 DIAGNOSIS — G8220 Paraplegia, unspecified: Secondary | ICD-10-CM

## 2015-05-13 DIAGNOSIS — R05 Cough: Secondary | ICD-10-CM

## 2015-05-13 DIAGNOSIS — R059 Cough, unspecified: Secondary | ICD-10-CM

## 2015-05-13 NOTE — Telephone Encounter (Signed)
Patient's wife called back to discuss results and she would like to proceed with CT chest wwo contrast.   Rhyse Skowron K. Posey Pronto, DO

## 2015-05-13 NOTE — Telephone Encounter (Signed)
Order placed.  Mrs. Biswell notified and will call [facility] to schedule the appointment.

## 2015-05-15 ENCOUNTER — Ambulatory Visit
Admission: RE | Admit: 2015-05-15 | Discharge: 2015-05-15 | Disposition: A | Discharge status: Home / Self Care | Payer: BLUE CROSS/BLUE SHIELD | Source: Ambulatory Visit | Attending: Neurology | Admitting: Neurology

## 2015-05-15 DIAGNOSIS — G609 Hereditary and idiopathic neuropathy, unspecified: Secondary | ICD-10-CM

## 2015-05-15 DIAGNOSIS — R059 Cough, unspecified: Secondary | ICD-10-CM

## 2015-05-15 DIAGNOSIS — R278 Other lack of coordination: Secondary | ICD-10-CM

## 2015-05-15 DIAGNOSIS — R0600 Dyspnea, unspecified: Secondary | ICD-10-CM

## 2015-05-15 DIAGNOSIS — R05 Cough: Secondary | ICD-10-CM

## 2015-05-15 MED ORDER — IOPAMIDOL (ISOVUE-300) INJECTION 61%
75.0000 mL | Freq: Once | INTRAVENOUS | Status: AC | PRN
  Administered 2015-05-15: 75 mL via INTRAVENOUS

## 2015-05-19 ENCOUNTER — Telehealth: Payer: Self-pay | Admitting: Neurology

## 2015-05-19 NOTE — Telephone Encounter (Signed)
I attempted to contact patient via phone today regarding the results of CT chest, however there was no answer so a message was left for the patient to return my call.

## 2015-06-08 ENCOUNTER — Encounter: Payer: Self-pay | Admitting: Neurology

## 2015-06-08 ENCOUNTER — Ambulatory Visit: Payer: BLUE CROSS/BLUE SHIELD | Admitting: Neurology

## 2015-06-08 ENCOUNTER — Ambulatory Visit (INDEPENDENT_AMBULATORY_CARE_PROVIDER_SITE_OTHER): Payer: BLUE CROSS/BLUE SHIELD | Admitting: Neurology

## 2015-06-08 VITALS — BP 160/84 | HR 74 | Wt 268.2 lb

## 2015-06-08 DIAGNOSIS — G609 Hereditary and idiopathic neuropathy, unspecified: Secondary | ICD-10-CM | POA: Diagnosis not present

## 2015-06-08 DIAGNOSIS — G8220 Paraplegia, unspecified: Secondary | ICD-10-CM

## 2015-06-08 DIAGNOSIS — R278 Other lack of coordination: Secondary | ICD-10-CM | POA: Diagnosis not present

## 2015-06-08 MED ORDER — GABAPENTIN 100 MG PO CAPS: 100.0000 mg | ORAL_CAPSULE | Freq: Every day | ORAL | Status: DC

## 2015-06-08 NOTE — Patient Instructions (Addendum)
1.  Start gabapentin 100mg  in the morning  2.  Continue gabapentin 300mg  at bedtime 3.  Second opinion at [facility] 4.  Please call of my office if you would like to start physical therapy 5.  Recommend using a cane for gait assistance   Return to clinic in 3 months

## 2015-06-08 NOTE — Progress Notes (Signed)
Follow-up Visit   Date: 06/08/2015   RICHARD RITCHEY MRN: [id number] DOB: 01-24-1956   Interim History: Eddie Foley is a 60 y.o. right-handed Caucasian male with GERD and OSA returning to the clinic for follow-up of peripheral neuropathy.  The patient was accompanied to the clinic by wife who also provides collateral information.    History of present illness: Starting in 2006, he recalled waking up with sudden onset of bilateral feel numbness, which remained constant. He was evaluated by a neurologist elsewhere who performed NCS/EMG and was told he had neuropathy (report not available). Since 2015, the numbness gradually has involved the lower legs to the level of the knees. He has associated burning sensation which is intermittent, but mostly he has constant numbness. He feels that his feet are weak and has to take breaks from walking even 20 feet. If he rests for a while, he can get back to walking. He feels unsteady and stumbles frequently, and has fallen six times in 2016.He walks unassisted.  He denies any numbness, tingling, or weakness of the hands. He had severe cramps of the legs which has been ongoing for some time. He takes magnesium and zinc supplements which helps.  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 He was seen by orthopeadics who did not recommend surgery.   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 He has noticed that he is unable to keep up walking with colleagues as well as even family.   UPDATE 04/24/2015:  He underwent CSF testing which has returned normal, except 3 OCB presents in serum and CSF.  Labs thus far do not show signs of inflammation.  He continues to have weakness of the legs and numbness/tingling.  No interval falls, but he  has been tripping frequently.  There is mild weakness of the hands, especially with grasping objects.  Painful paresthesias have improved with gabapentin 337m at bedtime.  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  UPDATE 06/08/2015:  Extensive serology testing showed mild elevation in ACE and otherwise was normal (see below).  Subsequent CT chest did not show any lymphadenopathy, but there was evidence of a small metal foreign object at the apex - patient was unaware of this.  He had a cardiac catherization 2104yrago but did not have any intervention.  He denies having any chest trauma or surgery.  In early January, he began noticing numbness involving his left posterior thigh and intermittent numbness in the hands.  He is dropping objects with his right hand because he is unable to feel the sensation, not so much weakness.  He is still working full-time and endorses difficulty completing his day-to-day job responsibilities.  His workplace does not have a more sedentary position for him.  His paresthesias are well controlled on gabapentin 30034mt bedtime, but due to sedative side effects, he cannot take the medication during the day.       Medications:  Current Outpatient Prescriptions on File Prior to Visit  Medication Sig Dispense Refill  . EPINEPHrine 0.3 mg/0.3 mL IJ SOAJ injection Inject 0.3 mLs (0.3 mg total) into the muscle once. 2 Device 2  . famotidine (PEPCID) 20 MG tablet Take 20 mg by mouth daily.      .Marland Kitchen  gabapentin (NEURONTIN) 300 MG capsule Take one tablet at bedtime for one week, then increase to one tablet twice daily. (Patient taking differently: Take 300 mg by mouth at bedtime. ) 60 capsule 5  . metFORMIN (GLUCOPHAGE) 500 MG tablet Take one half tablets twice daily with meals. 30 tablet 2  . NEXIUM 40 MG capsule TAKE ONE CAPSULE BY MOUTH ONCE DAILY 14 capsule 0  . OVER THE COUNTER MEDICATION Calcium, magnessium, and zinc. Takes one a day      No current facility-administered medications on file prior to visit.    Allergies:  Allergies  Allergen Reactions  . Bee Venom Anaphylaxis    Review of Systems:  CONSTITUTIONAL: No fevers, chills, night sweats, or weight loss.  EYES: No visual changes or eye pain ENT: No hearing changes.  No history of nose bleeds.   RESPIRATORY: No cough, wheezing and shortness of breath.   CARDIOVASCULAR: Negative for chest pain, and palpitations.   GI: Negative for abdominal discomfort, blood in stools or black stools.  No recent change in bowel habits.   GU:  No history of incontinence.   MUSCLOSKELETAL: No history of joint pain or swelling.  No myalgias.   SKIN: Negative for lesions, rash, and itching.   ENDOCRINE: Negative for cold or heat intolerance, polydipsia or goiter.   PSYCH:  No depression or anxiety symptoms.   NEURO: As Above.   Vital Signs:  BP 160/84 mmHg  Pulse 74  Wt 268 lb 3 oz (121.649 kg)  SpO2 97% Neurological Exam: MENTAL STATUS including orientation to time, place, person, recent and remote memory, attention span and concentration, language, and fund of knowledge is normal.  Speech is not dysarthric.  CRANIAL NERVES:  Pupils equal round and reactive to light.  Normal conjugate, extra-ocular eye movements in all directions of gaze.  Face is symmetric.    MOTOR:  Moderate TA atrophy bilaterally. No fasciculations or abnormal movements.  No pronator drift.  Tone is normal.    Right Upper Extremity:    Left Upper Extremity:    Deltoid  5/5   Deltoid  5/5   Biceps  5/5   Biceps  5/5   Triceps  5/5   Triceps  5/5   Wrist extensors  5/5   Wrist extensors  5/5   Wrist flexors  5/5   Wrist flexors  5/5   Finger extensors  5/5   Finger extensors  5/5   Finger flexors  5/5   Finger flexors  5/5   Dorsal interossei  5-/5   Dorsal interossei  5-/5   Abductor pollicis  5/5   Abductor pollicis  5/5   Tone (Ashworth scale)  0  Tone (Ashworth scale)  0   Right Lower  Extremity:    Left Lower Extremity:    Hip flexors  5-/5   Hip flexors  5-/5   Hip extensors  5-/5   Hip extensors  5-/5   Knee flexors  4+/5   Knee flexors  5/5   Knee extensors  5-/5   Knee extensors  5-/5   Dorsiflexors  4-/5   Dorsiflexors  4-/5   Plantarflexors  4-/5   Plantarflexors  4-/5   Toe extensors  3/5   Toe extensors  3-/5   Toe flexors  3/5   Toe flexors  2-/5   Tone (Ashworth scale)  0  Tone (Ashworth scale)  0   MSRs:  Right  Left brachioradialis 2+  brachioradialis 2+  biceps 1+  biceps 1+  triceps 0  triceps 0  Patellar 0  Patellar 0  ankle jerk 0  ankle jerk 0   SENSORY:   Vibration is reduced to 50% at the knees and absent at ankles, pin prick and temperature absent distal to knees bilaterally. Impaired proprioception at the great toe.  Markedly positive Rhomberg testing.  GAIT:  Gait is wide-based, slow, and cautious, mild dragging of the feet, worse on the left.   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 Overall, these findings are severe in degree electrically with respect to the sensory nerves.  NCS/EMG of the legs 02/24/2015: The electrophysiologic findings are most consistent with an active on chronic sensorimotor polyneuropathy, predominantly axon loss in type, affecting the lower extremities. Overall, these findings are severe in degree electrically.  MRI lumbar spine 08/01/2014: Central to left posterior lateral disc herniation at L3-4 with a 1.3cm free fragment likely to cause neural compression on the left. Small right extra foraminal disc herniation at L2-3 which could have some potential to affect the exiting L2 nerve root. Chronic degenerative disc disease and degenerative facet disease at L4-5 and L5-S1 without definite  nerve root compression.  Labs 02/21/2015: B12 510, folate 7.9, TSH 2.620 Labs 03/12/2015:  2-hr glucose tolerance testing positive [id number], ESR 9, CRP 0.2, zinc 75, copper 87,ceruloplasmin 23, CK 477*, aldolase 10.4*, SPEP/UPEP with IFE No M protein  Labs 04/24/2015: Vit B6 8.2, B1 16, vitamin E 1.5, celiac's neg, ANA neg, cryoglobulins neg, ANCA panel neg, lyme neg, HIV neg, heavy metal screen (mercury, lead, arsenic for basic) neg, SSA/B neg, ACE 61*  CSF 04/03/2015:  W2  R0  G75  P37 , OCB 3 bands present in the CSF and serum, MBP > 2.0, IgG index 0.46  CT chest w contrast 05/15/2015: Lungs clear. No demonstrable adenopathy. Scattered foci of atherosclerotic calcification in the aorta. Small metallic foreign body at the level of the left apex pericardium of uncertain etiology. There is no surrounding inflammation or fluid. The pericardium is not thickened. There is hepatic steatosis. Gallbladder is absent.   IMPRESSION/PLAN: Mr. Herrod is a very pleasant 60 year-old gentleman returning for evaluation of bilateral leg paresthesias and weakness. He was diagnosed with neuropathy ~2006 with numbness restricted to the feet until 2015. Since early 2016, there has been gradually worsening numbness such that now it involves up to the knees, weakness of the feet, and gait difficulty.  Electrodiagnostic testing of the legs shows a severe active on chronic peripheral neuropathy but there was also evidence of active changes in the lumbar paraspinal muscles, so a polyradiculoneuropathy was considered, however CSF testing returned normal.  NCS of the upper extremities consider predominately sensory polyneuropathy without demyelinating features. Extensive serology testing including autoimmune, inflammatory, infiltrative, and nutrient deficiencies is also normal.  Mild elevation in ACE was followed by with CT chest which did not demonstrate lymphadenopathy.  His glucose tolerance testing with positive,  but the degree of neuropathy would be unusual with new onset diabetes.  There is mild CK elevation.    Because his neuropathy continue to be progressive and etiology remains uncertain, I would like to seek a second opinion at [facility] to be sure a treatable type of neuropathy has not been overlooked.  I also mentioned that sometimes muscle and  nerve biopsy may be indicated to look for infiltrative diseases.   PLAN/RECOMMENDATIONS:  1.  Referral to [facility] for second opinion 2.  Start gabapentin 11m in the morning and continue gabapentin 3049mat bedtime 3.  Work restrictions recommended due to safety reasons 4.  PT declined, but I strongly recommended this and especially AFO 5.  On a side note, his CT chest showed a metal foreign body in the left pericardium apex of uncertain etiology.  I will share these results with patient's PCP to see if this needs to be investigated further.  Return to clinic in 3 months or sooner as needed    The duration of this appointment visit was 35 minutes of face-to-face time with the patient.  Greater than 50% of this time was spent in counseling, explanation of diagnosis, planning of further management, and coordination of care.   Thank you for allowing me to participate in patient's care.  If I can answer any additional questions, I would be pleased to do so.    Sincerely,    Donika K. PaPosey ProntoDO

## 2015-06-12 ENCOUNTER — Encounter: Payer: Self-pay | Admitting: Family Medicine

## 2015-06-12 ENCOUNTER — Ambulatory Visit (INDEPENDENT_AMBULATORY_CARE_PROVIDER_SITE_OTHER): Payer: BLUE CROSS/BLUE SHIELD | Admitting: Family Medicine

## 2015-06-12 VITALS — BP 132/84 | Ht 67.0 in | Wt 271.0 lb

## 2015-06-12 DIAGNOSIS — G8220 Paraplegia, unspecified: Secondary | ICD-10-CM | POA: Diagnosis not present

## 2015-06-12 DIAGNOSIS — R7303 Prediabetes: Secondary | ICD-10-CM | POA: Diagnosis not present

## 2015-06-12 LAB — POCT GLYCOSYLATED HEMOGLOBIN (HGB A1C): Hemoglobin A1C: 5.7

## 2015-06-12 NOTE — Progress Notes (Signed)
   Subjective:    Patient ID: Eddie Foley, male    DOB: [birth year], 60 y.o.   MRN: [id number]  HPI Patient is here today for a follow up visit on prediabetes. Patient is doing very well. Patient also would like to follow up on some tests he had done with Dr. Posey Pronto.   patient states that he has been doing a good job up until the past 3 weeks watching diet he was losing weight but now he states he's have a little harder time he is trying to get back and habit of healthy eating  Patient states that one of the tests shows a piece of metal beside his heart.    Review of Systems  patient denies any chest tightness pressure pain shortness of breath he does relate bilateral leg pain discomfort numbness and weakness    Objective:   Physical Exam   lungs clear heart regular extremities trace edema  Patient has a hard time walking hard time standing because of the neuropathy in his feet      Assessment & Plan:   CAT scan shows a very small piece of metal. Patient uncertain where it came from. It does not appear to be in a place that requires removal. I will discuss with radiology if any further testing is necessary   diabetes good control continue current measures watch diet try to lose weight   patient is disabled because of his bilateral painful polyneuropathy. He is using Neurontin. Has seen a neurologist cannot figure out what's going on in is going to be seen a specialist at [facility]. I believe this patient is disabled I do not believe there is any way that he can do his work I do not believe that he can safely ambulate without risk of falling

## 2015-06-16 ENCOUNTER — Telehealth: Payer: Self-pay | Admitting: Family Medicine

## 2015-06-16 NOTE — Telephone Encounter (Signed)
nurse's- please let the patient know that I did in fact call and discuss the case with a different radiologist at [facility] regarding his recent CAT scan. The radiologist stated that he did not feel that this small spot was metallic. This radiologist feels that is most likely a small calcium deposit in does not recommend any further imaging. This should not cause any problems at all long-term. The main reason for the call is to let the patient know that we did in fact look into this and discuss it with the radiology group.

## 2015-06-16 NOTE — Telephone Encounter (Signed)
Spoke with patient and informed him per Dr.Scott Luking-Dr.Scott did in fact call and discuss the case with a different radiologist at [facility] regarding his recent CAT scan. The radiologist stated that he did not feel that this small spot was metallic. This radiologist feels that is most likely a small calcium deposit in does not recommend any further imaging. This should not cause any problems at all long-term. Patient verbalized understanding.

## 2015-06-19 ENCOUNTER — Ambulatory Visit: Payer: BLUE CROSS/BLUE SHIELD | Admitting: Nutrition

## 2015-06-26 ENCOUNTER — Encounter: Payer: Self-pay | Admitting: *Deleted

## 2015-06-26 ENCOUNTER — Ambulatory Visit (INDEPENDENT_AMBULATORY_CARE_PROVIDER_SITE_OTHER): Payer: BLUE CROSS/BLUE SHIELD | Admitting: Family Medicine

## 2015-06-26 ENCOUNTER — Encounter: Payer: Self-pay | Admitting: Family Medicine

## 2015-06-26 VITALS — BP 128/82 | Ht 67.0 in | Wt 268.0 lb

## 2015-06-26 DIAGNOSIS — G609 Hereditary and idiopathic neuropathy, unspecified: Secondary | ICD-10-CM

## 2015-06-26 DIAGNOSIS — G500 Trigeminal neuralgia: Secondary | ICD-10-CM | POA: Diagnosis not present

## 2015-06-26 DIAGNOSIS — S2242XD Multiple fractures of ribs, left side, subsequent encounter for fracture with routine healing: Secondary | ICD-10-CM

## 2015-06-26 DIAGNOSIS — G8220 Paraplegia, unspecified: Secondary | ICD-10-CM

## 2015-06-26 MED ORDER — GABAPENTIN 100 MG PO CAPS: 100.0000 mg | ORAL_CAPSULE | Freq: Every day | ORAL | Status: DC

## 2015-06-26 MED ORDER — ALBUTEROL SULFATE HFA 108 (90 BASE) MCG/ACT IN AERS: 2.0000 | INHALATION_SPRAY | RESPIRATORY_TRACT | Status: DC | PRN

## 2015-06-26 NOTE — Progress Notes (Signed)
   Subjective:    Patient ID: Eddie Foley, male    DOB: 1955-06-07, 60 y.o.   MRN: [id number]  HPI  Patient arrives for a follow up from the [facility] for cracked ribs. Patient tripped and fell and cracked ribs. Patient relates a little bit of wheezes intermittently over the past couple days no respiratory difficulty. Does hurt when he takes a deep breath. Neuropathy in his legs are getting worse he has a hard time feeling his feet if they lay in on something unstable or hit something he cannot compensate to keep his balance Patient Eddie Foley narrative regarding what has gone on with him and how it is affected by right over this and I agree with you his narrative it is scanned into the system He relates he is having a fair amount of neuralgia symptoms on the right side of his face his dental specialist thought it was bad teeth recommended root canals he is had 3 root canals and still having severe sharp lancing pain on the right side of his face at times a burning sensation. Denies any other particular troubles.  Patient would also like to discuss trigeminal neuralgia.  Review of Systems Severe neuropathy of the lower legs severe discomforts into the right jawline region    Objective:   Physical Exam  Tenderness in the lower ribs on the left side lungs respiratory rate is normal clear for the most part but he does have some slight wheezes noted scattered throughout heart regular neck no masses severe neuropathy of the lower legs some trace edema in the feet      Assessment & Plan:  Patient with multiple problems going on Severe neuropathy of the legs this is causing increased trouble. He is using hydrocodone for the pain also gabapentin for the pain but he is still having significant troubles. We will gradually increase the dose of the gabapentin to try to get at her relief as long as it does not cause drowsiness  Patient was instability and falling-I have advised this patient not to work he is  at too great of the danger of falling and injuring himself see discussion above. 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 We will see if gabapentin will help. I will message his neurologist to see if they recommend Tegretol in addition to gabapentin or just try to increase the dose of the gabapentin or switch him totally to Tegretol  He is using hydrocodone intermittently for the rib discomfort he states he has enough currently  Having a little bit of reactive airway albuterol when necessary he is currently on amoxicillin if he starts running fever he will let us now  Patient also states that orthopedics is hoping to do shoulder surgery in about 4-6 weeks in my opinion this would be fine. They are asking Dr. Posey Pronto to make sure there is nothing from a neurologic standpoint which would interfere force shoulder surgery

## 2015-07-29 ENCOUNTER — Ambulatory Visit: Payer: BLUE CROSS/BLUE SHIELD | Admitting: Neurology

## 2015-09-09 ENCOUNTER — Other Ambulatory Visit (INDEPENDENT_AMBULATORY_CARE_PROVIDER_SITE_OTHER): Payer: BLUE CROSS/BLUE SHIELD

## 2015-09-09 ENCOUNTER — Ambulatory Visit (INDEPENDENT_AMBULATORY_CARE_PROVIDER_SITE_OTHER): Payer: BLUE CROSS/BLUE SHIELD | Admitting: Neurology

## 2015-09-09 ENCOUNTER — Encounter: Payer: Self-pay | Admitting: Neurology

## 2015-09-09 VITALS — BP 140/88 | HR 70 | Ht 67.0 in | Wt 265.4 lb

## 2015-09-09 DIAGNOSIS — G8220 Paraplegia, unspecified: Secondary | ICD-10-CM

## 2015-09-09 DIAGNOSIS — R278 Other lack of coordination: Secondary | ICD-10-CM

## 2015-09-09 DIAGNOSIS — G251 Drug-induced tremor: Secondary | ICD-10-CM

## 2015-09-09 DIAGNOSIS — G609 Hereditary and idiopathic neuropathy, unspecified: Secondary | ICD-10-CM

## 2015-09-09 LAB — CK: Total CK: 466 U/L — ABNORMAL HIGH (ref 7–232)

## 2015-09-09 LAB — FOLATE: FOLATE: 14.4 ng/mL (ref >5.9)

## 2015-09-09 LAB — VITAMIN B12: Vitamin B-12: 336 pg/mL (ref 211–911)

## 2015-09-09 LAB — TSH: TSH: 1.56 u[IU]/mL (ref 0.35–4.50)

## 2015-09-09 MED ORDER — GABAPENTIN 100 MG PO CAPS: ORAL_CAPSULE | ORAL | Status: DC

## 2015-09-09 MED ORDER — GABAPENTIN 300 MG PO CAPS: 300.0000 mg | ORAL_CAPSULE | Freq: Two times a day (BID) | ORAL | Status: DC

## 2015-09-09 NOTE — Progress Notes (Signed)
Follow-up Visit   Date: 09/09/2015   [name] MRN: [id number] DOB: 06-19-1955   Interim History: Eddie Foley is a 60 y.o. right-handed Caucasian male with GERD and OSA returning to the clinic for follow-up of peripheral neuropathy.  The patient was accompanied to the clinic by wife who also provides collateral information.    History of present illness: Starting in 2006, he recalled waking up with sudden onset of bilateral feel numbness, which remained constant. He was evaluated by a neurologist elsewhere who performed NCS/EMG and was told he had neuropathy (report not available). Since 2015, the numbness gradually has involved the lower legs to the level of the knees. He has associated burning sensation which is intermittent, but mostly he has constant numbness. He feels that his feet are weak and has to take breaks from walking even 20 feet. If he rests for a while, he can get back to walking. He feels unsteady and stumbles frequently, and has fallen six times in 2016.He walks unassisted.  He denies any numbness, tingling, or weakness of the hands. He had severe cramps of the legs which has been ongoing for some time. He takes magnesium and zinc supplements which helps.  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 He was seen by orthopeadics who did not recommend surgery.   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 He has noticed that he is unable to keep up walking with colleagues as well as even family.   UPDATE 04/24/2015:  He underwent CSF testing which has returned normal, except 3 OCB presents in serum and CSF.  Labs thus far do not show signs of inflammation.  There is mild weakness of the hands, especially with grasping objects.  Painful paresthesias  have improved with gabapentin 359m at bedtime.  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  UPDATE 06/08/2015:  Extensive serology testing showed mild elevation in ACE and otherwise was normal (see below).  Subsequent CT chest did not show any lymphadenopathy, but there was evidence of a small metal foreign object at the apex - patient was unaware of this.  He had a cardiac catherization 239yrago but did not have any intervention.  He denies having any chest trauma or surgery.  In early January, he began noticing numbness involving his left posterior thigh and intermittent numbness in the hands.  He is dropping objects with his right hand because he is unable to feel the sensation, not so much weakness.  He is still working full-time and endorses difficulty completing his day-to-day job responsibilities.   UPDATE 09/09/2015:  He has noticed worsening numbness of the legs, up to the level of the thighs.  Weakness of his feet and imbalance is worse.  He has not had any falls, but stumbles daily. He continues to walk unassisted. He started noticing tremulousness of the hands which started a week ago.  He was able to increase his gabapentin 30035mwice daily.    [facility] to [facility] opinion and had sensory motor paraneoplastic panel as well as repeat SPEP/UPEP with IFE.  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    He stopped working on April 27th and has since filed for disability.  Medications:  Current Outpatient Prescriptions on File Prior to Visit  Medication Sig Dispense Refill  . albuterol (PROVENTIL HFA;VENTOLIN HFA) 108 (90 Base) MCG/ACT inhaler Inhale 2 puffs into the lungs every 4 (four) hours as needed for wheezing. 1 Inhaler 2  . EPINEPHrine 0.3 mg/0.3 mL IJ SOAJ injection  Inject 0.3 mLs (0.3 mg total) into the muscle once. 2 Device 2  . famotidine (PEPCID) 20 MG tablet Take 20 mg by mouth daily.      . metFORMIN (GLUCOPHAGE) 500 MG tablet Take one half tablets twice daily with meals. 30 tablet 2  . NEXIUM 40 MG capsule TAKE ONE CAPSULE BY MOUTH ONCE DAILY 14 capsule 0  . OVER THE COUNTER MEDICATION Calcium, magnessium, and zinc. Takes one a day     No current facility-administered medications on file prior to visit.    Allergies:  Allergies  Allergen Reactions  . Bee Pollen Anaphylaxis  . Bee Venom Anaphylaxis    Review of Systems:  CONSTITUTIONAL: No fevers, chills, night sweats, or weight loss.  EYES: No visual changes or eye pain ENT: No hearing changes.  No history of nose bleeds.   RESPIRATORY: No cough, wheezing and shortness of breath.   CARDIOVASCULAR: Negative for chest pain, and palpitations.   GI: Negative for abdominal discomfort, blood in stools or black stools.  No recent change in bowel habits.   GU:  No history of incontinence.   MUSCLOSKELETAL: No history of joint pain or swelling.  No myalgias.   SKIN: Negative for lesions, rash, and itching.   ENDOCRINE: Negative for cold or heat intolerance, polydipsia or goiter.   PSYCH:  No depression +anxiety symptoms.   NEURO: As Above.   Vital Signs:  BP 140/88 mmHg  Pulse 70  Ht _0  (1.702 m)  Wt 265 lb 7 oz (120.402 kg)  BMI 41.56 kg/m2  SpO2 97% Neurological Exam: MENTAL STATUS including orientation to time, place, person, recent and remote memory, attention span and concentration, language, and fund of knowledge is normal.  Speech is not dysarthric.  CRANIAL NERVES:  Pupils equal round and reactive to light.  Normal conjugate, extra-ocular eye movements in all directions of gaze.  Face is symmetric. Face is symmetric and motor strength intact.  No tongue fasciculations.    MOTOR:  Moderate TA atrophy bilaterally. No fasciculations.  Irregular erratic low amplitude tremor of the  hands when outstretched. No pronator drift.  Tone is normal.    Right Upper Extremity:    Left Upper Extremity:    Deltoid  5/5   Deltoid  5/5   Biceps  5/5   Biceps  5/5   Triceps  5/5   Triceps  5/5   Wrist extensors  5/5   Wrist extensors  5/5   Wrist flexors  5/5   Wrist flexors  5/5   Finger extensors  5/5   Finger extensors  5/5   Finger flexors  5/5   Finger flexors  5/5   Dorsal interossei  4+/5   Dorsal interossei  4+/5   Abductor pollicis  5/5   Abductor pollicis  5/5   Tone (Ashworth scale)  0  Tone (Ashworth scale)  0   Right Lower Extremity:    Left Lower Extremity:    Hip flexors  5-/5   Hip flexors  5-/5  Hip extensors  5-/5   Hip extensors  5-/5   Knee flexors  4+/5   Knee flexors  5/5   Knee extensors  5-/5   Knee extensors  5-/5   Dorsiflexors  4-/5   Dorsiflexors  4-/5   Plantarflexors  4-/5   Plantarflexors  4-/5   Toe extensors  3-/5   Toe extensors  1/5   Toe flexors  3-/5   Toe flexors  1/5   Tone (Ashworth scale)  0  Tone (Ashworth scale)  0   MSRs:  Right                                                                 Left brachioradialis 2+  brachioradialis 2+  biceps 1+  biceps 1+  triceps 0  triceps 0  Patellar 0  Patellar 0  ankle jerk 0  ankle jerk 0   SENSORY:   Vibration is reduced to 50% at the knees and absent at ankles, pin prick and temperature absent distal to knees bilaterally.Markedly positive Rhomberg testing.  GAIT:  Gait is wide-based, slow, and cautious, dragging of the feet, worse on the left.   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 Overall, these findings are severe in degree electrically with respect to the sensory nerves.  NCS/EMG of the legs 02/24/2015: The electrophysiologic findings are most consistent with an active on chronic sensorimotor  polyneuropathy, predominantly axon loss in type, affecting the lower extremities. Overall, these findings are severe in degree electrically.  MRI lumbar spine 08/01/2014: Central to left posterior lateral disc herniation at L3-4 with a 1.3cm free fragment likely to cause neural compression on the left. Small right extra foraminal disc herniation at L2-3 which could have some potential to affect the exiting L2 nerve root. Chronic degenerative disc disease and degenerative facet disease at L4-5 and L5-S1 without definite nerve root compression.  Labs 02/21/2015: B12 510, folate 7.9, TSH 2.620 Labs 03/12/2015:  2-hr glucose tolerance testing positive [id number], ESR 9, CRP 0.2, zinc 75, copper 87,ceruloplasmin 23, CK 477*, aldolase 10.4*, SPEP/UPEP with IFE No M protein  Labs 04/24/2015: Vit B6 8.2, B1 16, vitamin E 1.5, celiac's neg, ANA neg, cryoglobulins neg, ANCA panel neg, lyme neg, HIV neg, heavy metal screen (mercury, lead, arsenic for basic) neg, SSA/B neg, ACE 61*  CSF 04/03/2015:  W2  R0  G75  P37 , OCB 3 bands present in the CSF and serum, MBP > 2.0, IgG index 0.46  CT chest w contrast 05/15/2015: Lungs clear. No demonstrable adenopathy. Scattered foci of atherosclerotic calcification in the aorta. Small metallic foreign body at the level of the left apex pericardium of uncertain etiology. There is no surrounding inflammation or fluid. The pericardium is not thickened. There is hepatic steatosis. Gallbladder is absent.   IMPRESSION/PLAN: Mr. Hockey is a very pleasant 60 year-old gentleman returning for follow-up of severe sensorimotor polyneuropathy manifesting with bilateral leg paresthesias and weakness. He was diagnosed with neuropathy ~2006 with numbness restricted to the feet until 2015. Since early 2016, there has been gradually worsening numbness such that now it involves up to the knees, weakness of the  feet, and gait difficulty.  Electrodiagnostic testing of the legs shows  a severe active on chronic peripheral neuropathy but there was also evidence of active changes in the lumbar paraspinal muscles, so a polyradiculoneuropathy was considered, however CSF testing returned normal.  NCS of the upper extremities consider predominately sensory polyneuropathy without demyelinating features. Extensive serology testing including autoimmune, inflammatory, infiltrative, and nutrient deficiencies is also normal.  Mild elevation in ACE was followed by with CT chest which did not demonstrate lymphadenopathy.  His glucose tolerance testing with positive, but the degree of neuropathy would be unusual with new onset diabetes.  There is mild CK elevation.    Because his neuropathy continue to be progressive and etiology remains uncertain, Second opinion at [facility] was sought.  He saw. Dr. Duke Salvia who suggested possible occupational chemical/toxin exposure causing his neuropathy.  Sensory motor paraneoplastic panel returned normal.   At this juncture, management is symptomatic.  For his left > right foot drop, I will refer him to PT for orthotics evaluation and therapy.  I am not sure why he has developed new tremulousness of the hands. Gabapentin can sometimes cause abnormal movements, so I will try to taper this and see if there is improvement.  In the meantime, labs will also be checked.    PLAN/RECOMMENDATIONS:  1.  Check thyroid, copper, CK, vitamin B12, folate 2.  Reduce gabapentin to 260m in the morning and continue gabapentin 3047mat bedtime to see if movements improve 3.  Start physical therapy and discuss options for ankle foot orthotic, especially for left foot 4.  Start using a cane for your balance 5.  Consider repeat NCS/EMG going forward 6.  Driving safety and precautions discussed, he may benefit from hand controls to maintain driving independence  Return to clinic in 3 months  The duration of this appointment visit was 40 minutes of face-to-face time with the  patient.  Greater than 50% of this time was spent in counseling, explanation of diagnosis, planning of further management, and coordination of care.   Thank you for allowing me to participate in patient's care.  If I can answer any additional questions, I would be pleased to do so.    Sincerely,    Trichelle Lehan K. PaPosey ProntoDO

## 2015-09-09 NOTE — Patient Instructions (Addendum)
1.  Check blood work  2.  Reduce gabapentin to 200mg  in the morning and continue gabapentin 300mg  at bedtime 3.  Start physical therapy and discuss options for ankle foot orthotics.   4.  Start using a cane for your balance 5.  Driving precautions discussed, consider hand controls going forward   Return to clinic in 3 months

## 2015-09-12 LAB — COPPER, SERUM: COPPER: 96 ug/dL (ref 72–166)

## 2015-09-25 DIAGNOSIS — Z029 Encounter for administrative examinations, unspecified: Secondary | ICD-10-CM

## 2015-09-29 ENCOUNTER — Inpatient Hospital Stay (HOSPITAL_BASED_OUTPATIENT_CLINIC_OR_DEPARTMENT_OTHER)
Admission: EM | Admit: 2015-09-29 | Discharge: 2015-10-16 | DRG: 392 | Disposition: A | Discharge status: Home / Self Care | Payer: BLUE CROSS/BLUE SHIELD | Attending: Surgery | Admitting: Surgery

## 2015-09-29 ENCOUNTER — Encounter (HOSPITAL_COMMUNITY): Payer: Self-pay | Admitting: Emergency Medicine

## 2015-09-29 ENCOUNTER — Emergency Department (HOSPITAL_BASED_OUTPATIENT_CLINIC_OR_DEPARTMENT_OTHER): Payer: BLUE CROSS/BLUE SHIELD

## 2015-09-29 ENCOUNTER — Encounter (HOSPITAL_BASED_OUTPATIENT_CLINIC_OR_DEPARTMENT_OTHER): Payer: Self-pay

## 2015-09-29 ENCOUNTER — Emergency Department (HOSPITAL_COMMUNITY)
Admission: EM | Admit: 2015-09-29 | Discharge: 2015-09-29 | Disposition: A | Discharge status: Home / Self Care | Payer: BLUE CROSS/BLUE SHIELD | Source: Home / Self Care

## 2015-09-29 DIAGNOSIS — Z6839 Body mass index (BMI) 39.0-39.9, adult: Secondary | ICD-10-CM | POA: Diagnosis not present

## 2015-09-29 DIAGNOSIS — R109 Unspecified abdominal pain: Secondary | ICD-10-CM | POA: Insufficient documentation

## 2015-09-29 DIAGNOSIS — R339 Retention of urine, unspecified: Secondary | ICD-10-CM | POA: Diagnosis not present

## 2015-09-29 DIAGNOSIS — K567 Ileus, unspecified: Secondary | ICD-10-CM | POA: Diagnosis not present

## 2015-09-29 DIAGNOSIS — R110 Nausea: Secondary | ICD-10-CM | POA: Diagnosis not present

## 2015-09-29 DIAGNOSIS — G5793 Unspecified mononeuropathy of bilateral lower limbs: Secondary | ICD-10-CM | POA: Diagnosis present

## 2015-09-29 DIAGNOSIS — K631 Perforation of intestine (nontraumatic): Secondary | ICD-10-CM

## 2015-09-29 DIAGNOSIS — E46 Unspecified protein-calorie malnutrition: Secondary | ICD-10-CM | POA: Diagnosis present

## 2015-09-29 DIAGNOSIS — K5792 Diverticulitis of intestine, part unspecified, without perforation or abscess without bleeding: Secondary | ICD-10-CM

## 2015-09-29 DIAGNOSIS — K5720 Diverticulitis of large intestine with perforation and abscess without bleeding: Secondary | ICD-10-CM | POA: Diagnosis present

## 2015-09-29 DIAGNOSIS — E876 Hypokalemia: Secondary | ICD-10-CM | POA: Diagnosis not present

## 2015-09-29 DIAGNOSIS — E669 Obesity, unspecified: Secondary | ICD-10-CM

## 2015-09-29 DIAGNOSIS — R509 Fever, unspecified: Secondary | ICD-10-CM

## 2015-09-29 DIAGNOSIS — R197 Diarrhea, unspecified: Secondary | ICD-10-CM | POA: Diagnosis not present

## 2015-09-29 DIAGNOSIS — J989 Respiratory disorder, unspecified: Secondary | ICD-10-CM | POA: Diagnosis present

## 2015-09-29 DIAGNOSIS — D72829 Elevated white blood cell count, unspecified: Secondary | ICD-10-CM | POA: Diagnosis present

## 2015-09-29 DIAGNOSIS — G8220 Paraplegia, unspecified: Secondary | ICD-10-CM | POA: Diagnosis present

## 2015-09-29 DIAGNOSIS — G4730 Sleep apnea, unspecified: Secondary | ICD-10-CM | POA: Diagnosis present

## 2015-09-29 DIAGNOSIS — G8918 Other acute postprocedural pain: Secondary | ICD-10-CM

## 2015-09-29 DIAGNOSIS — K219 Gastro-esophageal reflux disease without esophagitis: Secondary | ICD-10-CM | POA: Diagnosis present

## 2015-09-29 DIAGNOSIS — K651 Peritoneal abscess: Secondary | ICD-10-CM

## 2015-09-29 DIAGNOSIS — Z9049 Acquired absence of other specified parts of digestive tract: Secondary | ICD-10-CM | POA: Diagnosis not present

## 2015-09-29 HISTORY — DX: Paraplegia, unspecified: G82.20

## 2015-09-29 HISTORY — DX: Inflammatory disease of prostate, unspecified: N41.9

## 2015-09-29 LAB — CBC WITH DIFFERENTIAL/PLATELET
BASOS ABS: 0 10*3/uL (ref 0.0–0.1)
BASOS PCT: 0 %
EOS ABS: 0 10*3/uL (ref 0.0–0.7)
Eosinophils Relative: 0 %
HCT: 45.7 % (ref 39.0–52.0)
HEMOGLOBIN: 15.5 g/dL (ref 13.0–17.0)
Lymphocytes Relative: 4 %
Lymphs Abs: 0.8 10*3/uL (ref 0.7–4.0)
MCH: 30.7 pg (ref 26.0–34.0)
MCHC: 33.9 g/dL (ref 30.0–36.0)
MCV: 90.5 fL (ref 78.0–100.0)
MONOS PCT: 9 %
Monocytes Absolute: 1.6 10*3/uL — ABNORMAL HIGH (ref 0.1–1.0)
NEUTROS ABS: 16.4 10*3/uL — AB (ref 1.7–7.7)
NEUTROS PCT: 87 %
Platelets: 249 10*3/uL (ref 150–400)
RBC: 5.05 MIL/uL (ref 4.22–5.81)
RDW: 14 % (ref 11.5–15.5)
WBC: 18.9 10*3/uL — AB (ref 4.0–10.5)

## 2015-09-29 LAB — CBC
HEMATOCRIT: 46.6 % (ref 39.0–52.0)
Hemoglobin: 15.2 g/dL (ref 13.0–17.0)
MCH: 29.8 pg (ref 26.0–34.0)
MCHC: 32.6 g/dL (ref 30.0–36.0)
MCV: 91.4 fL (ref 78.0–100.0)
PLATELETS: 247 10*3/uL (ref 150–400)
RBC: 5.1 MIL/uL (ref 4.22–5.81)
RDW: 13.6 % (ref 11.5–15.5)
WBC: 17.9 10*3/uL — AB (ref 4.0–10.5)

## 2015-09-29 LAB — COMPREHENSIVE METABOLIC PANEL
ALBUMIN: 3.5 g/dL (ref 3.5–5.0)
ALBUMIN: 3.6 g/dL (ref 3.5–5.0)
ALK PHOS: 63 U/L (ref 38–126)
ALK PHOS: 62 U/L (ref 38–126)
ALT: 23 U/L (ref 17–63)
ALT: 22 U/L (ref 17–63)
AST: 22 U/L (ref 15–41)
AST: 24 U/L (ref 15–41)
Anion gap: 10 (ref 5–15)
Anion gap: 10 (ref 5–15)
BILIRUBIN TOTAL: 0.8 mg/dL (ref 0.3–1.2)
BUN: 11 mg/dL (ref 6–20)
BUN: 13 mg/dL (ref 6–20)
CALCIUM: 9.3 mg/dL (ref 8.9–10.3)
CALCIUM: 8.9 mg/dL (ref 8.9–10.3)
CHLORIDE: 101 mmol/L (ref 101–111)
CO2: 24 mmol/L (ref 22–32)
CO2: 23 mmol/L (ref 22–32)
CREATININE: 1.28 mg/dL — AB (ref 0.61–1.24)
CREATININE: 1.2 mg/dL (ref 0.61–1.24)
Chloride: 102 mmol/L (ref 101–111)
GFR calc Af Amer: >60 mL/min (ref >60)
GFR, EST NON AFRICAN AMERICAN: 59 mL/min — AB (ref >60)
GLUCOSE: 193 mg/dL — AB (ref 65–99)
Glucose, Bld: 198 mg/dL — ABNORMAL HIGH (ref 65–99)
Potassium: 4.5 mmol/L (ref 3.5–5.1)
Potassium: 4.6 mmol/L (ref 3.5–5.1)
SODIUM: 134 mmol/L — AB (ref 135–145)
Sodium: 136 mmol/L (ref 135–145)
TOTAL PROTEIN: 7.3 g/dL (ref 6.5–8.1)
TOTAL PROTEIN: 7.5 g/dL (ref 6.5–8.1)
Total Bilirubin: 0.7 mg/dL (ref 0.3–1.2)

## 2015-09-29 LAB — LIPASE, BLOOD
LIPASE: 16 U/L (ref 11–51)
Lipase: 18 U/L (ref 11–51)

## 2015-09-29 LAB — URINALYSIS, ROUTINE W REFLEX MICROSCOPIC
Glucose, UA: 500 mg/dL — AB
HGB URINE DIPSTICK: NEGATIVE
KETONES UR: 15 mg/dL — AB
Leukocytes, UA: NEGATIVE
Nitrite: NEGATIVE
PROTEIN: 100 mg/dL — AB
Specific Gravity, Urine: 1.036 — ABNORMAL HIGH (ref 1.005–1.030)
pH: 5.5 (ref 5.0–8.0)

## 2015-09-29 LAB — URINE MICROSCOPIC-ADD ON

## 2015-09-29 LAB — I-STAT CG4 LACTIC ACID, ED: LACTIC ACID, VENOUS: 1.88 mmol/L (ref 0.5–2.0)

## 2015-09-29 MED ORDER — PANTOPRAZOLE SODIUM 40 MG IV SOLR
40.0000 mg | Freq: Every day | INTRAVENOUS | Status: DC
  Administered 2015-09-29 – 2015-10-01 (×3): 40 mg via INTRAVENOUS
  Filled 2015-09-29 (×4): qty 40

## 2015-09-29 MED ORDER — ALBUTEROL SULFATE HFA 108 (90 BASE) MCG/ACT IN AERS: 2.0000 | INHALATION_SPRAY | RESPIRATORY_TRACT | Status: DC | PRN

## 2015-09-29 MED ORDER — IOPAMIDOL (ISOVUE-300) INJECTION 61%
100.0000 mL | Freq: Once | INTRAVENOUS | Status: AC | PRN
  Administered 2015-09-29: 100 mL via INTRAVENOUS

## 2015-09-29 MED ORDER — ONDANSETRON HCL 4 MG/2ML IJ SOLN
4.0000 mg | Freq: Once | INTRAMUSCULAR | Status: AC
  Administered 2015-09-29: 4 mg via INTRAVENOUS
  Filled 2015-09-29: qty 2

## 2015-09-29 MED ORDER — LEVOFLOXACIN IN D5W 750 MG/150ML IV SOLN
750.0000 mg | Freq: Once | INTRAVENOUS | Status: AC
  Administered 2015-09-29: 750 mg via INTRAVENOUS
  Filled 2015-09-29: qty 150

## 2015-09-29 MED ORDER — PIPERACILLIN-TAZOBACTAM 3.375 G IVPB
3.3750 g | Freq: Three times a day (TID) | INTRAVENOUS | Status: DC
  Administered 2015-09-29 – 2015-10-02 (×8): 3.375 g via INTRAVENOUS
  Filled 2015-09-29 (×9): qty 50

## 2015-09-29 MED ORDER — METRONIDAZOLE IN NACL 5-0.79 MG/ML-% IV SOLN
500.0000 mg | Freq: Once | INTRAVENOUS | Status: AC
  Administered 2015-09-29: 500 mg via INTRAVENOUS
  Filled 2015-09-29: qty 100

## 2015-09-29 MED ORDER — MORPHINE SULFATE (PF) 4 MG/ML IV SOLN
4.0000 mg | Freq: Once | INTRAVENOUS | Status: AC
  Administered 2015-09-29: 4 mg via INTRAVENOUS
  Filled 2015-09-29: qty 1

## 2015-09-29 MED ORDER — ACETAMINOPHEN 325 MG PO TABS
650.0000 mg | ORAL_TABLET | Freq: Four times a day (QID) | ORAL | Status: DC | PRN
  Administered 2015-09-30 – 2015-10-06 (×2): 650 mg via ORAL
  Filled 2015-09-29 (×2): qty 2

## 2015-09-29 MED ORDER — SODIUM CHLORIDE 0.9 % IV BOLUS (SEPSIS)
1000.0000 mL | Freq: Once | INTRAVENOUS | Status: AC
  Administered 2015-09-29: 1000 mL via INTRAVENOUS

## 2015-09-29 MED ORDER — LACTATED RINGERS IV SOLN: INTRAVENOUS | Status: DC

## 2015-09-29 MED ORDER — ONDANSETRON 4 MG PO TBDP
4.0000 mg | ORAL_TABLET | Freq: Four times a day (QID) | ORAL | Status: DC | PRN
  Administered 2015-10-14: 4 mg via ORAL
  Filled 2015-09-29: qty 1

## 2015-09-29 MED ORDER — ALBUTEROL SULFATE (2.5 MG/3ML) 0.083% IN NEBU
2.5000 mg | INHALATION_SOLUTION | RESPIRATORY_TRACT | Status: DC | PRN
Start: 2015-09-29 — End: 2015-10-16
  Administered 2015-10-06 – 2015-10-15 (×6): 2.5 mg via RESPIRATORY_TRACT
  Filled 2015-09-29 (×6): qty 3

## 2015-09-29 MED ORDER — HEPARIN SODIUM (PORCINE) 5000 UNIT/ML IJ SOLN
5000.0000 [IU] | Freq: Three times a day (TID) | INTRAMUSCULAR | Status: DC
  Administered 2015-09-29 – 2015-10-02 (×8): 5000 [IU] via SUBCUTANEOUS
  Filled 2015-09-29 (×11): qty 1

## 2015-09-29 MED ORDER — SODIUM CHLORIDE 0.9 % IV SOLN
INTRAVENOUS | Status: DC
  Administered 2015-09-29: 21:00:00 via INTRAVENOUS

## 2015-09-29 MED ORDER — ONDANSETRON HCL 4 MG/2ML IJ SOLN
4.0000 mg | Freq: Four times a day (QID) | INTRAMUSCULAR | Status: DC | PRN
  Administered 2015-09-30 – 2015-10-15 (×13): 4 mg via INTRAVENOUS
  Filled 2015-09-29 (×14): qty 2

## 2015-09-29 MED ORDER — MORPHINE SULFATE (PF) 2 MG/ML IV SOLN
2.0000 mg | INTRAVENOUS | Status: DC | PRN
  Administered 2015-09-29 – 2015-10-02 (×19): 4 mg via INTRAVENOUS
  Administered 2015-10-02: 2 mg via INTRAVENOUS
  Administered 2015-10-02 (×2): 4 mg via INTRAVENOUS
  Administered 2015-10-03 – 2015-10-05 (×5): 2 mg via INTRAVENOUS
  Administered 2015-10-05 – 2015-10-06 (×9): 6 mg via INTRAVENOUS
  Administered 2015-10-06: 4 mg via INTRAVENOUS
  Administered 2015-10-06: 6 mg via INTRAVENOUS
  Administered 2015-10-06: 4 mg via INTRAVENOUS
  Administered 2015-10-06 (×2): 6 mg via INTRAVENOUS
  Administered 2015-10-06: 4 mg via INTRAVENOUS
  Administered 2015-10-07 – 2015-10-08 (×11): 6 mg via INTRAVENOUS
  Administered 2015-10-09: 4 mg via INTRAVENOUS
  Filled 2015-09-29: qty 3
  Filled 2015-09-29 (×2): qty 2
  Filled 2015-09-29: qty 3
  Filled 2015-09-29 (×2): qty 2
  Filled 2015-09-29 (×2): qty 3
  Filled 2015-09-29: qty 2
  Filled 2015-09-29: qty 3
  Filled 2015-09-29 (×3): qty 2
  Filled 2015-09-29: qty 3
  Filled 2015-09-29: qty 2
  Filled 2015-09-29 (×2): qty 3
  Filled 2015-09-29 (×2): qty 1
  Filled 2015-09-29 (×2): qty 2
  Filled 2015-09-29: qty 1
  Filled 2015-09-29: qty 2
  Filled 2015-09-29: qty 1
  Filled 2015-09-29 (×2): qty 3
  Filled 2015-09-29: qty 2
  Filled 2015-09-29 (×3): qty 3
  Filled 2015-09-29 (×2): qty 2
  Filled 2015-09-29: qty 3
  Filled 2015-09-29: qty 2
  Filled 2015-09-29 (×2): qty 3
  Filled 2015-09-29: qty 2
  Filled 2015-09-29: qty 1
  Filled 2015-09-29: qty 2
  Filled 2015-09-29: qty 3
  Filled 2015-09-29 (×2): qty 2
  Filled 2015-09-29: qty 3
  Filled 2015-09-29 (×4): qty 2
  Filled 2015-09-29 (×5): qty 3
  Filled 2015-09-29 (×2): qty 2
  Filled 2015-09-29: qty 3

## 2015-09-29 MED ORDER — ACETAMINOPHEN 500 MG PO TABS
1000.0000 mg | ORAL_TABLET | Freq: Once | ORAL | Status: AC
  Administered 2015-09-29: 1000 mg via ORAL
  Filled 2015-09-29: qty 2

## 2015-09-29 NOTE — ED Notes (Signed)
Pts wife stated she will be taking her husband to a different campus and "they told me yall needed to discharge him" Pt and wife walked out ED doors

## 2015-09-29 NOTE — ED Notes (Signed)
Report given to [facility] on [facility].

## 2015-09-29 NOTE — ED Notes (Signed)
MD at bedside discussing the plan of care

## 2015-09-29 NOTE — H&P (Signed)
Eddie Foley is an 60 y.o. male.    Chief Complaint: Abdominal pain  HPI: He is a 60 year old male with no previous history of diverticulitis. 2 days ago he had the gradual onset of pain which was initially in his lower mid abdomen and left lower quadrant.The following morning, yesterday, the pain was much more severe. It has been constant ever since. Since yesterday the pain has been radiating up into his upper abdomen and his left upper quadrant. Worse with any motion. He has also had fever and chills yesterday and today. He thought maybe it was a prostate infection as he had had before the pain became much worse. His wife states that for several weeks to possibly 2-3 months he has been having some difficulty with bowel movements with loose stools and occasional small amount of blood and a burning pain at his rectum with bowel movements. His symptoms continued today and he presented to [facility] for evaluation. With CT scan findings as Below he was transferred to [facility] for emergency admission. No urinary symptoms.  Past Medical History  Diagnosis Date  . Hernia 8110    umbilical  . GERD (gastroesophageal reflux disease)   . Neuropathy (HCC)     legs  . Impaired fasting glucose   . Reactive airway disease   . Prostate infection     Past Surgical History  Procedure Laterality Date  . Cholecystectomy    . Hernia repair  3159    umbilical hernia  . Shoulder arthroscopy      left    Family History  Problem Relation Age of Onset  . Heart disease Mother   . Lung cancer Father     lung  . Prostate cancer Brother   . Heart disease Brother   . Diabetes Other   . Diabetes Other   . Healthy Son     x 3  . Healthy Daughter   . Neuropathy Neg Hx    Social History:  reports that he has never smoked. He does not have any smokeless tobacco history on file. He reports that he does not drink alcohol or use illicit drugs.  Allergies:  Allergies  Allergen  Reactions  . Bee Pollen Anaphylaxis  . Bee Venom Anaphylaxis    Medications Prior to Admission  Medication Sig Dispense Refill  . albuterol (PROVENTIL HFA;VENTOLIN HFA) 108 (90 Base) MCG/ACT inhaler Inhale 2 puffs into the lungs every 4 (four) hours as needed for wheezing. 1 Inhaler 2  . docusate sodium (COLACE) 100 MG capsule Take 100 mg by mouth.    . EPINEPHrine 0.3 mg/0.3 mL IJ SOAJ injection Inject 0.3 mLs (0.3 mg total) into the muscle once. 2 Device 2  . famotidine (PEPCID) 20 MG tablet Take 20 mg by mouth daily.      Marland Kitchen gabapentin (NEURONTIN) 100 MG capsule Take 2 tablets in the morning 180 capsule 3  . gabapentin (NEURONTIN) 300 MG capsule Take 1 capsule (300 mg total) by mouth 2 (two) times daily. 180 capsule 3  . metFORMIN (GLUCOPHAGE) 500 MG tablet Take one half tablets twice daily with meals. 30 tablet 2  . NEXIUM 40 MG capsule TAKE ONE CAPSULE BY MOUTH ONCE DAILY 14 capsule 0  . OVER THE COUNTER MEDICATION Calcium, magnessium, and zinc. Takes one a day      Results for orders placed or performed during the hospital encounter of 09/29/15 (from the past 48 hour(s))  Urinalysis, Routine w reflex microscopic (not  at [facility])     Status: Abnormal   Collection Time: 09/29/15  6:33 PM  Result Value Ref Range   Color, Urine AMBER (A) YELLOW    Comment: BIOCHEMICALS MAY BE AFFECTED BY COLOR   APPearance CLEAR CLEAR   Specific Gravity, Urine 1.036 (H) 1.005 - 1.030   pH 5.5 5.0 - 8.0   Glucose, UA 500 (A) NEGATIVE mg/dL   Hgb urine dipstick NEGATIVE NEGATIVE   Bilirubin Urine SMALL (A) NEGATIVE   Ketones, ur 15 (A) NEGATIVE mg/dL   Protein, ur 100 (A) NEGATIVE mg/dL   Nitrite NEGATIVE NEGATIVE   Leukocytes, UA NEGATIVE NEGATIVE  Urine microscopic-add on     Status: Abnormal   Collection Time: 09/29/15  6:33 PM  Result Value Ref Range   Squamous Epithelial / LPF 0-5 (A) NONE SEEN   WBC, UA 0-5 0 - 5 WBC/hpf   RBC / HPF 0-5 0 - 5 RBC/hpf   Bacteria, UA FEW (A) NONE SEEN   Casts  HYALINE CASTS (A) NEGATIVE   Urine-Other MUCOUS PRESENT   CBC with Differential     Status: Abnormal   Collection Time: 09/29/15  6:45 PM  Result Value Ref Range   WBC 18.9 (H) 4.0 - 10.5 K/uL   RBC 5.05 4.22 - 5.81 MIL/uL   Hemoglobin 15.5 13.0 - 17.0 g/dL   HCT 45.7 39.0 - 52.0 %   MCV 90.5 78.0 - 100.0 fL   MCH 30.7 26.0 - 34.0 pg   MCHC 33.9 30.0 - 36.0 g/dL   RDW 14.0 11.5 - 15.5 %   Platelets 249 150 - 400 K/uL   Neutrophils Relative % 87 %   Neutro Abs 16.4 (H) 1.7 - 7.7 K/uL   Lymphocytes Relative 4 %   Lymphs Abs 0.8 0.7 - 4.0 K/uL   Monocytes Relative 9 %   Monocytes Absolute 1.6 (H) 0.1 - 1.0 K/uL   Eosinophils Relative 0 %   Eosinophils Absolute 0.0 0.0 - 0.7 K/uL   Basophils Relative 0 %   Basophils Absolute 0.0 0.0 - 0.1 K/uL  Comprehensive metabolic panel     Status: Abnormal   Collection Time: 09/29/15  6:45 PM  Result Value Ref Range   Sodium 134 (L) 135 - 145 mmol/L   Potassium 4.6 3.5 - 5.1 mmol/L   Chloride 101 101 - 111 mmol/L   CO2 23 22 - 32 mmol/L   Glucose, Bld 198 (H) 65 - 99 mg/dL   BUN 13 6 - 20 mg/dL   Creatinine, Ser 1.20 0.61 - 1.24 mg/dL   Calcium 8.9 8.9 - 10.3 mg/dL   Total Protein 7.5 6.5 - 8.1 g/dL   Albumin 3.6 3.5 - 5.0 g/dL   AST 24 15 - 41 U/L   ALT 22 17 - 63 U/L   Alkaline Phosphatase 62 38 - 126 U/L   Total Bilirubin 0.7 0.3 - 1.2 mg/dL   GFR calc non Af Amer >60 >60 mL/min   GFR calc Af Amer >60 >60 mL/min    Comment: (NOTE) The eGFR has been calculated using the CKD EPI equation. This calculation has not been validated in all clinical situations. eGFR's persistently <60 mL/min signify possible Chronic Kidney Disease.    Anion gap 10 5 - 15  Lipase, blood     Status: None   Collection Time: 09/29/15  6:45 PM  Result Value Ref Range   Lipase 16 11 - 51 U/L  I-Stat CG4 Lactic Acid, ED  Status: None   Collection Time: 09/29/15  8:19 PM  Result Value Ref Range   Lactic Acid, Venous 1.88 0.5 - 2.0 mmol/L   Ct Abdomen  Pelvis W Contrast  09/29/2015  CLINICAL DATA:  Abdominal pain, nausea/vomiting, prior cholecystectomy EXAM: CT ABDOMEN AND PELVIS WITH CONTRAST TECHNIQUE: Multidetector CT imaging of the abdomen and pelvis was performed using the standard protocol following bolus administration of intravenous contrast. CONTRAST:  19m ISOVUE-300 IOPAMIDOL (ISOVUE-300) INJECTION 61% COMPARISON:  None. FINDINGS: Lower chest: Minimal patchy opacity at the right lung base, likely atelectasis. Hepatobiliary: Hepatic steatosis. Status post cholecystectomy. No intrahepatic or extrahepatic ductal dilatation. Pancreas: Within normal limits. Spleen: Within normal limits. Adrenals/Urinary Tract: Adrenal glands are within normal limits. 5 x 2 mm nonobstructing right lower pole renal calculus (series 2/ image 47). Kidneys are otherwise within normal limits. No hydronephrosis. Bladder is underdistended but unremarkable. Stomach/Bowel: Stomach is notable for a small hiatal hernia. No evidence of bowel obstruction. Appendix is not discretely visualized. Sigmoid diverticulosis, with focal wall thickening/ inflammatory changes (series 2/ image 69), suggesting sigmoid diverticulitis. Small foci of fluid/ gas tracking along the sigmoid mesocolon (series 2/ image 64) and along the retroperitoneum (series 2/ image 61), including a 2.6 x 3.5 cm clustered gas-predominant collection with trace fluid anterior to the aortic bifurcation and inferior to the 3rd portion of the duodenum (series 2/ image 55). This appearance is compatible with perforated diverticulitis. No drainable fluid collection/abscess at the current time. Despite the localized gas noted above, there is no free air. Vascular/Lymphatic: No evidence of abdominal aortic aneurysm. No suspicious abdominopelvic lymphadenopathy. Reproductive: Prostate is unremarkable. Other: No abdominopelvic ascites. Fat within the left inguinal canal. Musculoskeletal: Mild degenerative changes of the visualized  thoracolumbar spine. IMPRESSION: Sigmoid diverticulitis. Suspected localized perforation, including a 2.6 x 3.5 cm clustered gas/fluid collection (gas predominant) along the retroperitoneum. No drainable fluid collection/abscess at the current time. No free air. Electronically Signed   By: SJulian HyM.D.   On: 09/29/2015 19:37    Review of Systems  Constitutional: Positive for fever and chills.  Respiratory: Negative.   Cardiovascular: Negative.   Gastrointestinal: Positive for nausea, vomiting, abdominal pain, diarrhea and blood in stool. Negative for constipation and melena.  Genitourinary: Negative.   Neurological: Positive for sensory change.    Blood pressure 144/89, pulse 124, temperature 102.5 F (39.2 C), temperature source Oral, resp. rate 17, height _0  (1.727 m), weight 118.389 kg (261 lb), SpO2 97 %. Physical Exam  General: Alert, Obese Caucasian male who appears uncomfortable but in no severe distress. Skin: Warm and dry without rash or infection. Skin of lower extremities below the knees cool HEENT: No palpable masses or thyromegaly. Sclera nonicteric. Pupils equal round and reactive. Oropharynx clear. Lymph nodes: No cervical, supraclavicular, or inguinal nodes palpable. Lungs: Breath sounds clear and equal without increased work of breathing Cardiovascular: Regular tachycardia without murmur. No JVD or edema. Peripheral pulses intact. Abdomen:Obese. Nondistended. There is marked tenderness in the lower abdomen particularly the left side with guarding and also significant tenderness in the left upper quadrant. Mild tenderness in the right abdomen without significant guarding. Bowel sounds are hypoactive. No masses palpable. No organomegaly. No palpable hernias. Extremities: No edema or joint swelling or deformity. No chronic venous stasis changes. Coolness and pallor to lower extremities below-knee's Neurologic: Alert and fully oriented. Very poor sensation of lower  extremities below the knees. No gross motor deficits.  Assessment/Plan Severe sigmoid diverticulitis with localized perforation and abscess into the  mesentery and retroperitoneum. Patient has high fever and tachycardia which is worrisome but lactate normal and he overall does not appear severely ill.  CT scan although with a significant localized problem does not show any evidence of free perforation or free air or fluid in the abdomen.  I discussed the findings with the patient and his family. He is significantly ill but I believe an initial attempt at nonoperative management is reasonable as his abscess on CT is actually fairly small and  Contained within the mesentery. Antibiotics will be changed to Zosyn for broader coverage. I think he is somewhat dry contributing to his tachycardia IV rehydration ordered.  Blood cultures obtained. We discussed that if he does not improve quickly he is at significant risk for needing emergency surgery and colostomy. All questions answered.  Edward Jolly, MD 09/29/2015, 10:24 PM

## 2015-09-29 NOTE — ED Provider Notes (Signed)
CSN: [id number]     Arrival date & time 09/29/15  1809 History  By signing my name below, I, Jasmyn B. Alexander, attest that this documentation has been prepared under the direction and in the presence of Isla Pence, MD.  Electronically Signed: Tedra Coupe. Sheppard Coil, ED Scribe. 09/29/2015. 6:57 PM.      Chief Complaint  Patient presents with  . Abdominal Pain    The history is provided by the patient and the spouse. No language interpreter was used.    HPI Comments: RODD POLAN is a 60 y.o. male with PMHx of prostate infection and GERD who presents to the Emergency Department complaining of gradual onset, diffuse, "severe" abdominal pain x 2 days. Pt reports that pain is similar to past episode when diagnosed with Prostate infection a few years prior. Pt has associated vomiting, diarrhea, mild dysuria, and myalgias.  There are no modifying factors. Per patient's wife, pt has PSHx of hernia repair in 2012 with mesh located in the central abdomen and a Cholecystectomy. Pt initially went to [place] ED but LWBS due to wait.    Past Medical History  Diagnosis Date  . Hernia 0000000    umbilical  . GERD (gastroesophageal reflux disease)   . Neuropathy (HCC)     legs  . Impaired fasting glucose   . Reactive airway disease   . Prostate infection    Past Surgical History  Procedure Laterality Date  . Cholecystectomy    . Hernia repair  0000000    umbilical hernia  . Shoulder arthroscopy      left   Family History  Problem Relation Age of Onset  . Heart disease Mother   . Lung cancer Father     lung  . Prostate cancer Brother   . Heart disease Brother   . Diabetes Other   . Diabetes Other   . Healthy Son     x 3  . Healthy Daughter   . Neuropathy Neg Hx    Social History  Substance Use Topics  . Smoking status: Never Smoker   . Smokeless tobacco: None  . Alcohol Use: No    Review of Systems  Gastrointestinal: Positive for nausea, vomiting, abdominal pain (Diffuse)  and diarrhea.  Genitourinary: Positive for dysuria.  Musculoskeletal: Positive for myalgias.  All other systems reviewed and are negative.  Allergies  Bee pollen and Bee venom  Home Medications   Prior to Admission medications   Medication Sig Start Date End Date Taking? Authorizing Provider  albuterol (PROVENTIL HFA;VENTOLIN HFA) 108 (90 Base) MCG/ACT inhaler Inhale 2 puffs into the lungs every 4 (four) hours as needed for wheezing. 06/26/15   Kathyrn Drown, MD  docusate sodium (COLACE) 100 MG capsule Take 100 mg by mouth.    Historical Provider, MD  EPINEPHrine 0.3 mg/0.3 mL IJ SOAJ injection Inject 0.3 mLs (0.3 mg total) into the muscle once. 09/24/14   Kathyrn Drown, MD  famotidine (PEPCID) 20 MG tablet Take 20 mg by mouth daily.      Historical Provider, MD  gabapentin (NEURONTIN) 100 MG capsule Take 2 tablets in the morning 09/09/15   Donika K Patel, DO  gabapentin (NEURONTIN) 300 MG capsule Take 1 capsule (300 mg total) by mouth 2 (two) times daily. 09/09/15   Donika Keith Rake, DO  metFORMIN (GLUCOPHAGE) 500 MG tablet Take one half tablets twice daily with meals. 04/10/15   Kathyrn Drown, MD  NEXIUM 40 MG capsule TAKE ONE CAPSULE BY  MOUTH ONCE DAILY 06/04/13   Kathyrn Drown, MD  OVER THE COUNTER MEDICATION Calcium, magnessium, and zinc. Takes one a day    Historical Provider, MD   BP 124/74 mmHg  Pulse 118  Temp(Src) 103.1 F (39.5 C) (Oral)  Resp 18  Ht 5\' 8"  (1.727 m)  Wt 261 lb (118.389 kg)  BMI 39.69 kg/m2  SpO2 95% Physical Exam  Constitutional: He is oriented to person, place, and time. He appears well-developed and well-nourished.  HENT:  Head: Normocephalic and atraumatic.  Dry mucus membranes.  Eyes: EOM are normal. Pupils are equal, round, and reactive to light.  Neck: Normal range of motion.  Cardiovascular: Normal rate and normal heart sounds.   Tachycardia  Pulmonary/Chest: Effort normal and breath sounds normal. No respiratory distress. He has no wheezes. He  has no rales.  Abdominal: Soft. Bowel sounds are normal. He exhibits no distension. There is tenderness. There is no rebound and no guarding.  Diffuse abdominal tenderness  Musculoskeletal: Normal range of motion.  Neurological: He is alert and oriented to person, place, and time.  Skin: Skin is warm and dry. No rash noted.  Psychiatric: He has a normal mood and affect. Judgment normal.  Nursing note and vitals reviewed.   ED Course  Procedures (including critical care time) DIAGNOSTIC STUDIES: Oxygen Saturation is 95% on RA, normal by my interpretation.    COORDINATION OF CARE: 6:24 PM-Discussed treatment plan which includes CT of Abdomen/Pelvis, CBC panel, CMP, UA with pt at bedside and pt agreed to plan. Will order Zofran, Morphine and IV Fluids.  Labs Review Labs Reviewed  CBC WITH DIFFERENTIAL/PLATELET - Abnormal; Notable for the following:    WBC 18.9 (*)    Neutro Abs 16.4 (*)    Monocytes Absolute 1.6 (*)    All other components within normal limits  COMPREHENSIVE METABOLIC PANEL - Abnormal; Notable for the following:    Sodium 134 (*)    Glucose, Bld 198 (*)    All other components within normal limits  URINALYSIS, ROUTINE W REFLEX MICROSCOPIC (NOT AT [facility]) - Abnormal; Notable for the following:    Color, Urine AMBER (*)    Specific Gravity, Urine 1.036 (*)    Glucose, UA 500 (*)    Bilirubin Urine SMALL (*)    Ketones, ur 15 (*)    Protein, ur 100 (*)    All other components within normal limits  URINE MICROSCOPIC-ADD ON - Abnormal; Notable for the following:    Squamous Epithelial / LPF 0-5 (*)    Bacteria, UA FEW (*)    Casts HYALINE CASTS (*)    All other components within normal limits  LIPASE, BLOOD  I-STAT CG4 LACTIC ACID, ED    Imaging Review Ct Abdomen Pelvis W Contrast  09/29/2015  CLINICAL DATA:  Abdominal pain, nausea/vomiting, prior cholecystectomy EXAM: CT ABDOMEN AND PELVIS WITH CONTRAST TECHNIQUE: Multidetector CT imaging of the abdomen and  pelvis was performed using the standard protocol following bolus administration of intravenous contrast. CONTRAST:  119mL ISOVUE-300 IOPAMIDOL (ISOVUE-300) INJECTION 61% COMPARISON:  None. FINDINGS: Lower chest: Minimal patchy opacity at the right lung base, likely atelectasis. Hepatobiliary: Hepatic steatosis. Status post cholecystectomy. No intrahepatic or extrahepatic ductal dilatation. Pancreas: Within normal limits. Spleen: Within normal limits. Adrenals/Urinary Tract: Adrenal glands are within normal limits. 5 x 2 mm nonobstructing right lower pole renal calculus (series 2/ image 47). Kidneys are otherwise within normal limits. No hydronephrosis. Bladder is underdistended but unremarkable. Stomach/Bowel: Stomach is notable for a  small hiatal hernia. No evidence of bowel obstruction. Appendix is not discretely visualized. Sigmoid diverticulosis, with focal wall thickening/ inflammatory changes (series 2/ image 69), suggesting sigmoid diverticulitis. Small foci of fluid/ gas tracking along the sigmoid mesocolon (series 2/ image 64) and along the retroperitoneum (series 2/ image 61), including a 2.6 x 3.5 cm clustered gas-predominant collection with trace fluid anterior to the aortic bifurcation and inferior to the 3rd portion of the duodenum (series 2/ image 55). This appearance is compatible with perforated diverticulitis. No drainable fluid collection/abscess at the current time. Despite the localized gas noted above, there is no free air. Vascular/Lymphatic: No evidence of abdominal aortic aneurysm. No suspicious abdominopelvic lymphadenopathy. Reproductive: Prostate is unremarkable. Other: No abdominopelvic ascites. Fat within the left inguinal canal. Musculoskeletal: Mild degenerative changes of the visualized thoracolumbar spine. IMPRESSION: Sigmoid diverticulitis. Suspected localized perforation, including a 2.6 x 3.5 cm clustered gas/fluid collection (gas predominant) along the retroperitoneum. No  drainable fluid collection/abscess at the current time. No free air. Electronically Signed   By: Julian Hy M.D.   On: 09/29/2015 19:37   I have personally reviewed and evaluated these images and lab results as part of my medical decision-making.   EKG Interpretation None      MDM  PT D/W DR. Excell Seltzer (SURGERY) WHO ACCEPTED PT FOR TRANSFER TO [facility]. PT NOTIFIED ABOUT DIAGNOSIS. Final diagnoses:  Acute diverticulitis  Perforated bowel ([place])   I personally performed the services described in this documentation, which was scribed in my presence. The recorded information has been reviewed and is accurate.   Isla Pence, MD 09/29/15 2207

## 2015-09-29 NOTE — ED Notes (Signed)
Pt sts generalized abd pain with N/V; pt sts hx of prostate infection that felt like similar

## 2015-09-29 NOTE — ED Notes (Signed)
abd pain, n/v/d x 2 days-presents to triage in w/c-was triaged at [facility]: [facility] ED earlier today and LWBS

## 2015-09-30 ENCOUNTER — Telehealth: Payer: Self-pay | Admitting: Family Medicine

## 2015-09-30 ENCOUNTER — Ambulatory Visit: Payer: BLUE CROSS/BLUE SHIELD | Admitting: Family Medicine

## 2015-09-30 LAB — CBC
HEMATOCRIT: 41.7 % (ref 39.0–52.0)
HEMOGLOBIN: 13.6 g/dL (ref 13.0–17.0)
MCH: 30.1 pg (ref 26.0–34.0)
MCHC: 32.6 g/dL (ref 30.0–36.0)
MCV: 92.3 fL (ref 78.0–100.0)
Platelets: 236 10*3/uL (ref 150–400)
RBC: 4.52 MIL/uL (ref 4.22–5.81)
RDW: 14.2 % (ref 11.5–15.5)
WBC: 16.5 10*3/uL — AB (ref 4.0–10.5)

## 2015-09-30 LAB — BASIC METABOLIC PANEL
ANION GAP: 8 (ref 5–15)
BUN: 13 mg/dL (ref 6–20)
CHLORIDE: 105 mmol/L (ref 101–111)
CO2: 24 mmol/L (ref 22–32)
Calcium: 8.4 mg/dL — ABNORMAL LOW (ref 8.9–10.3)
Creatinine, Ser: 1.14 mg/dL (ref 0.61–1.24)
GFR calc non Af Amer: >60 mL/min (ref >60)
Glucose, Bld: 138 mg/dL — ABNORMAL HIGH (ref 65–99)
POTASSIUM: 4.3 mmol/L (ref 3.5–5.1)
Sodium: 137 mmol/L (ref 135–145)

## 2015-09-30 LAB — GLUCOSE, CAPILLARY: Glucose-Capillary: 164 mg/dL — ABNORMAL HIGH (ref 65–99)

## 2015-09-30 MED ORDER — PROCHLORPERAZINE EDISYLATE 5 MG/ML IJ SOLN
5.0000 mg | Freq: Four times a day (QID) | INTRAMUSCULAR | Status: DC | PRN
  Administered 2015-09-30 – 2015-10-07 (×10): 5 mg via INTRAVENOUS
  Filled 2015-09-30 (×10): qty 2

## 2015-09-30 MED ORDER — DEXTROSE-NACL 5-0.9 % IV SOLN
INTRAVENOUS | Status: DC
  Administered 2015-09-30 – 2015-10-01 (×5): via INTRAVENOUS
  Administered 2015-10-02: 150 mL/h via INTRAVENOUS
  Administered 2015-10-02 – 2015-10-07 (×11): via INTRAVENOUS

## 2015-09-30 NOTE — Progress Notes (Signed)
Subjective: Feels better this morning.  Less pain.  Urine dark yellow.  Objective: Vital signs in last 24 hours: Temp:  [99.3 F (37.4 C)-103.2 F (39.6 C)] 102 F (38.9 C) (05/31 0520) Pulse Rate:  [111-124] 114 (05/31 0520) Resp:  [15-24] 16 (05/31 0520) BP: (116-146)/(64-89) 136/77 mmHg (05/31 0520) SpO2:  [95 %-98 %] 97 % (05/31 0520) Weight:  [118.389 kg (261 lb)-118.57 kg (261 lb 6.4 oz)] 118.389 kg (261 lb) (05/30 1823) Last BM Date: 09/29/15  Intake/Output from previous day:   Intake/Output this shift:    PE: General- In NAD Abdomen-soft, mild to moderate lower abdominal tenderness  Lab Results:   Recent Labs  09/29/15 1845 09/30/15 0506  WBC 18.9* 16.5*  HGB 15.5 13.6  HCT 45.7 41.7  PLT 249 236   BMET  Recent Labs  09/29/15 1845 09/30/15 0506  NA 134* 137  K 4.6 4.3  CL 101 105  CO2 23 24  GLUCOSE 198* 138*  BUN 13 13  CREATININE 1.20 1.14  CALCIUM 8.9 8.4*   PT/INR No results for input(s): LABPROT, INR in the last 72 hours. Comprehensive Metabolic Panel:    Component Value Date/Time   NA 137 09/30/2015 0506   NA 134* 09/29/2015 1845   NA 145* 02/21/2015 0907   NA 142 07/18/2014 1016   K 4.3 09/30/2015 0506   K 4.6 09/29/2015 1845   CL 105 09/30/2015 0506   CL 101 09/29/2015 1845   CO2 24 09/30/2015 0506   CO2 23 09/29/2015 1845   BUN 13 09/30/2015 0506   BUN 13 09/29/2015 1845   BUN 12 02/21/2015 0907   BUN 15 07/18/2014 1016   CREATININE 1.14 09/30/2015 0506   CREATININE 1.20 09/29/2015 1845   GLUCOSE 138* 09/30/2015 0506   GLUCOSE 198* 09/29/2015 1845   GLUCOSE 106* 02/21/2015 0907   GLUCOSE 101* 07/18/2014 1016   CALCIUM 8.4* 09/30/2015 0506   CALCIUM 8.9 09/29/2015 1845   AST 24 09/29/2015 1845   AST 22 09/29/2015 1625   ALT 22 09/29/2015 1845   ALT 23 09/29/2015 1625   ALKPHOS 62 09/29/2015 1845   ALKPHOS 63 09/29/2015 1625   BILITOT 0.7 09/29/2015 1845   BILITOT 0.8 09/29/2015 1625   BILITOT 0.2 02/21/2015 0907    PROT 7.5 09/29/2015 1845   PROT 7.3 09/29/2015 1625   PROT 6.2 02/21/2015 0907   ALBUMIN 3.6 09/29/2015 1845   ALBUMIN 3.5 09/29/2015 1625   ALBUMIN 4.1 02/21/2015 0907     Studies/Results: Ct Abdomen Pelvis W Contrast  09/29/2015  CLINICAL DATA:  Abdominal pain, nausea/vomiting, prior cholecystectomy EXAM: CT ABDOMEN AND PELVIS WITH CONTRAST TECHNIQUE: Multidetector CT imaging of the abdomen and pelvis was performed using the standard protocol following bolus administration of intravenous contrast. CONTRAST:  147mL ISOVUE-300 IOPAMIDOL (ISOVUE-300) INJECTION 61% COMPARISON:  None. FINDINGS: Lower chest: Minimal patchy opacity at the right lung base, likely atelectasis. Hepatobiliary: Hepatic steatosis. Status post cholecystectomy. No intrahepatic or extrahepatic ductal dilatation. Pancreas: Within normal limits. Spleen: Within normal limits. Adrenals/Urinary Tract: Adrenal glands are within normal limits. 5 x 2 mm nonobstructing right lower pole renal calculus (series 2/ image 47). Kidneys are otherwise within normal limits. No hydronephrosis. Bladder is underdistended but unremarkable. Stomach/Bowel: Stomach is notable for a small hiatal hernia. No evidence of bowel obstruction. Appendix is not discretely visualized. Sigmoid diverticulosis, with focal wall thickening/ inflammatory changes (series 2/ image 69), suggesting sigmoid diverticulitis. Small foci of fluid/ gas tracking along the sigmoid mesocolon (series 2/ image  64) and along the retroperitoneum (series 2/ image 61), including a 2.6 x 3.5 cm clustered gas-predominant collection with trace fluid anterior to the aortic bifurcation and inferior to the 3rd portion of the duodenum (series 2/ image 55). This appearance is compatible with perforated diverticulitis. No drainable fluid collection/abscess at the current time. Despite the localized gas noted above, there is no free air. Vascular/Lymphatic: No evidence of abdominal aortic aneurysm. No  suspicious abdominopelvic lymphadenopathy. Reproductive: Prostate is unremarkable. Other: No abdominopelvic ascites. Fat within the left inguinal canal. Musculoskeletal: Mild degenerative changes of the visualized thoracolumbar spine. IMPRESSION: Sigmoid diverticulitis. Suspected localized perforation, including a 2.6 x 3.5 cm clustered gas/fluid collection (gas predominant) along the retroperitoneum. No drainable fluid collection/abscess at the current time. No free air. Electronically Signed   By: Julian Hy M.D.   On: 09/29/2015 19:37    Anti-infectives: Anti-infectives    Start     Dose/Rate Route Frequency Ordered Stop   09/29/15 2230  piperacillin-tazobactam (ZOSYN) IVPB 3.375 g     3.375 g 12.5 mL/hr over 240 Minutes Intravenous Every 8 hours 09/29/15 2217     09/29/15 2015  levofloxacin (LEVAQUIN) IVPB 750 mg     750 mg 100 mL/hr over 90 Minutes Intravenous  Once 09/29/15 2002 09/29/15 2148   09/29/15 2015  metroNIDAZOLE (FLAGYL) IVPB 500 mg     500 mg 100 mL/hr over 60 Minutes Intravenous  Once 09/29/15 2002 09/29/15 2203      Assessment Active Problems:   Diverticulitis of large intestine with perforation and abscess-feels a little better this AM; WBC down.    LOS: 1 day   Plan: Continue bowel rest, IV abxs, hydration.  If he get worse or does not continue to get better-> OR for laparoscopic lavage, possible partial colectomy and colostomy.  Discussed with him and his wife and they seem to understand.   Nicolaos Mitrano J 09/30/2015

## 2015-09-30 NOTE — Telephone Encounter (Signed)
FYI   pts spouse calling this morning to let you know he was admitted to [facility] last night with Diverticulitis and a perforated colon.  They are trying to see if antibiotics will correct it before they try surgery  appt cancelled for today of course due to this hosp stay

## 2015-10-01 LAB — BASIC METABOLIC PANEL
Anion gap: 5 (ref 5–15)
BUN: 10 mg/dL (ref 6–20)
CHLORIDE: 108 mmol/L (ref 101–111)
CO2: 23 mmol/L (ref 22–32)
CREATININE: 1.24 mg/dL (ref 0.61–1.24)
Calcium: 7.9 mg/dL — ABNORMAL LOW (ref 8.9–10.3)
Glucose, Bld: 173 mg/dL — ABNORMAL HIGH (ref 65–99)
Potassium: 3.7 mmol/L (ref 3.5–5.1)
SODIUM: 136 mmol/L (ref 135–145)

## 2015-10-01 LAB — CBC
HCT: 38.6 % — ABNORMAL LOW (ref 39.0–52.0)
Hemoglobin: 12.6 g/dL — ABNORMAL LOW (ref 13.0–17.0)
MCH: 30.1 pg (ref 26.0–34.0)
MCHC: 32.6 g/dL (ref 30.0–36.0)
MCV: 92.1 fL (ref 78.0–100.0)
PLATELETS: 244 10*3/uL (ref 150–400)
RBC: 4.19 MIL/uL — AB (ref 4.22–5.81)
RDW: 14 % (ref 11.5–15.5)
WBC: 14.6 10*3/uL — AB (ref 4.0–10.5)

## 2015-10-01 NOTE — Progress Notes (Signed)
Subjective: Having less pain than yesterday.  Walking.  Voiding.  Objective: Vital signs in last 24 hours: Temp:  [99.1 F (37.3 C)-100.8 F (38.2 C)] 100.6 F (38.1 C) (06/01 0511) Pulse Rate:  [99-116] 115 (06/01 0511) Resp:  [18] 18 (06/01 0511) BP: (109-144)/(69-74) 138/74 mmHg (06/01 0511) SpO2:  [95 %-98 %] 95 % (06/01 0511) Last BM Date: 09/29/15 (morning)  Intake/Output from previous day: 05/31 0701 - 06/01 0700 In: 3015.8 [P.O.:150; I.V.:2815.8; IV Piggyback:50] Out: -  Intake/Output this shift: Total I/O In: -  Out: 200 [Urine:200]  PE: General- In NAD Abdomen-soft, less lower abdominal tenderness  Lab Results:   Recent Labs  09/30/15 0506 10/01/15 0534  WBC 16.5* 14.6*  HGB 13.6 12.6*  HCT 41.7 38.6*  PLT 236 244   BMET  Recent Labs  09/30/15 0506 10/01/15 0534  NA 137 136  K 4.3 3.7  CL 105 108  CO2 24 23  GLUCOSE 138* 173*  BUN 13 10  CREATININE 1.14 1.24  CALCIUM 8.4* 7.9*   PT/INR No results for input(s): LABPROT, INR in the last 72 hours. Comprehensive Metabolic Panel:    Component Value Date/Time   NA 136 10/01/2015 0534   NA 137 09/30/2015 0506   NA 145* 02/21/2015 0907   NA 142 07/18/2014 1016   K 3.7 10/01/2015 0534   K 4.3 09/30/2015 0506   CL 108 10/01/2015 0534   CL 105 09/30/2015 0506   CO2 23 10/01/2015 0534   CO2 24 09/30/2015 0506   BUN 10 10/01/2015 0534   BUN 13 09/30/2015 0506   BUN 12 02/21/2015 0907   BUN 15 07/18/2014 1016   CREATININE 1.24 10/01/2015 0534   CREATININE 1.14 09/30/2015 0506   GLUCOSE 173* 10/01/2015 0534   GLUCOSE 138* 09/30/2015 0506   GLUCOSE 106* 02/21/2015 0907   GLUCOSE 101* 07/18/2014 1016   CALCIUM 7.9* 10/01/2015 0534   CALCIUM 8.4* 09/30/2015 0506   AST 24 09/29/2015 1845   AST 22 09/29/2015 1625   ALT 22 09/29/2015 1845   ALT 23 09/29/2015 1625   ALKPHOS 62 09/29/2015 1845   ALKPHOS 63 09/29/2015 1625   BILITOT 0.7 09/29/2015 1845   BILITOT 0.8 09/29/2015 1625    BILITOT 0.2 02/21/2015 0907   PROT 7.5 09/29/2015 1845   PROT 7.3 09/29/2015 1625   PROT 6.2 02/21/2015 0907   ALBUMIN 3.6 09/29/2015 1845   ALBUMIN 3.5 09/29/2015 1625   ALBUMIN 4.1 02/21/2015 0907     Studies/Results: Ct Abdomen Pelvis W Contrast  09/29/2015  CLINICAL DATA:  Abdominal pain, nausea/vomiting, prior cholecystectomy EXAM: CT ABDOMEN AND PELVIS WITH CONTRAST TECHNIQUE: Multidetector CT imaging of the abdomen and pelvis was performed using the standard protocol following bolus administration of intravenous contrast. CONTRAST:  162mL ISOVUE-300 IOPAMIDOL (ISOVUE-300) INJECTION 61% COMPARISON:  None. FINDINGS: Lower chest: Minimal patchy opacity at the right lung base, likely atelectasis. Hepatobiliary: Hepatic steatosis. Status post cholecystectomy. No intrahepatic or extrahepatic ductal dilatation. Pancreas: Within normal limits. Spleen: Within normal limits. Adrenals/Urinary Tract: Adrenal glands are within normal limits. 5 x 2 mm nonobstructing right lower pole renal calculus (series 2/ image 47). Kidneys are otherwise within normal limits. No hydronephrosis. Bladder is underdistended but unremarkable. Stomach/Bowel: Stomach is notable for a small hiatal hernia. No evidence of bowel obstruction. Appendix is not discretely visualized. Sigmoid diverticulosis, with focal wall thickening/ inflammatory changes (series 2/ image 69), suggesting sigmoid diverticulitis. Small foci of fluid/ gas tracking along the sigmoid mesocolon (series 2/ image 64) and  along the retroperitoneum (series 2/ image 61), including a 2.6 x 3.5 cm clustered gas-predominant collection with trace fluid anterior to the aortic bifurcation and inferior to the 3rd portion of the duodenum (series 2/ image 55). This appearance is compatible with perforated diverticulitis. No drainable fluid collection/abscess at the current time. Despite the localized gas noted above, there is no free air. Vascular/Lymphatic: No evidence of  abdominal aortic aneurysm. No suspicious abdominopelvic lymphadenopathy. Reproductive: Prostate is unremarkable. Other: No abdominopelvic ascites. Fat within the left inguinal canal. Musculoskeletal: Mild degenerative changes of the visualized thoracolumbar spine. IMPRESSION: Sigmoid diverticulitis. Suspected localized perforation, including a 2.6 x 3.5 cm clustered gas/fluid collection (gas predominant) along the retroperitoneum. No drainable fluid collection/abscess at the current time. No free air. Electronically Signed   By: Julian Hy M.D.   On: 09/29/2015 19:37    Anti-infectives: Anti-infectives    Start     Dose/Rate Route Frequency Ordered Stop   09/29/15 2230  piperacillin-tazobactam (ZOSYN) IVPB 3.375 g     3.375 g 12.5 mL/hr over 240 Minutes Intravenous Every 8 hours 09/29/15 2217     09/29/15 2015  levofloxacin (LEVAQUIN) IVPB 750 mg     750 mg 100 mL/hr over 90 Minutes Intravenous  Once 09/29/15 2002 09/29/15 2148   09/29/15 2015  metroNIDAZOLE (FLAGYL) IVPB 500 mg     500 mg 100 mL/hr over 60 Minutes Intravenous  Once 09/29/15 2002 09/29/15 2203      Assessment Active Problems:   Diverticulitis of large intestine with perforation and abscess-he is slowly improving on IV abxs and bowel rest    LOS: 2 days   Plan: Continue bowel rest, IV abxs.   As before, if he gets worse or does not continue to get better-> OR for laparoscopic lavage, possible partial colectomy and colostomy.     Lorraina Spring J 10/01/2015

## 2015-10-02 ENCOUNTER — Inpatient Hospital Stay (HOSPITAL_COMMUNITY): Payer: BLUE CROSS/BLUE SHIELD | Admitting: Certified Registered Nurse Anesthetist

## 2015-10-02 ENCOUNTER — Encounter (HOSPITAL_COMMUNITY): Admission: EM | Disposition: A | Discharge status: Home / Self Care | Payer: Self-pay | Source: Home / Self Care

## 2015-10-02 ENCOUNTER — Encounter (HOSPITAL_COMMUNITY): Payer: Self-pay | Admitting: Anesthesiology

## 2015-10-02 HISTORY — PX: COLON RESECTION: SHX5231

## 2015-10-02 LAB — CBC
HEMATOCRIT: 38.5 % — AB (ref 39.0–52.0)
Hemoglobin: 12.4 g/dL — ABNORMAL LOW (ref 13.0–17.0)
MCH: 29.7 pg (ref 26.0–34.0)
MCHC: 32.2 g/dL (ref 30.0–36.0)
MCV: 92.3 fL (ref 78.0–100.0)
PLATELETS: 265 10*3/uL (ref 150–400)
RBC: 4.17 MIL/uL — ABNORMAL LOW (ref 4.22–5.81)
RDW: 14 % (ref 11.5–15.5)
WBC: 15.4 10*3/uL — ABNORMAL HIGH (ref 4.0–10.5)

## 2015-10-02 LAB — GLUCOSE, CAPILLARY
Glucose-Capillary: 159 mg/dL — ABNORMAL HIGH (ref 65–99)
Glucose-Capillary: 169 mg/dL — ABNORMAL HIGH (ref 65–99)
Glucose-Capillary: 171 mg/dL — ABNORMAL HIGH (ref 65–99)

## 2015-10-02 LAB — SURGICAL PCR SCREEN
MRSA, PCR: NEGATIVE
STAPHYLOCOCCUS AUREUS: NEGATIVE

## 2015-10-02 SURGERY — COLECTOMY, LEFT, LAPAROSCOPIC
Anesthesia: General

## 2015-10-02 MED ORDER — HYDROMORPHONE HCL 1 MG/ML IJ SOLN
0.2500 mg | INTRAMUSCULAR | Status: DC | PRN
  Administered 2015-10-02 (×2): 0.5 mg via INTRAVENOUS

## 2015-10-02 MED ORDER — HYDROMORPHONE HCL 2 MG/ML IJ SOLN
INTRAMUSCULAR | Status: AC
  Filled 2015-10-02: qty 1

## 2015-10-02 MED ORDER — NEOSTIGMINE METHYLSULFATE 10 MG/10ML IV SOLN
INTRAVENOUS | Status: AC
  Filled 2015-10-02: qty 1

## 2015-10-02 MED ORDER — ENOXAPARIN SODIUM 40 MG/0.4ML ~~LOC~~ SOLN
40.0000 mg | SUBCUTANEOUS | Status: DC
  Administered 2015-10-03 – 2015-10-08 (×5): 40 mg via SUBCUTANEOUS
  Filled 2015-10-02 (×7): qty 0.4

## 2015-10-02 MED ORDER — METOCLOPRAMIDE HCL 5 MG/ML IJ SOLN: 10.0000 mg | Freq: Once | INTRAMUSCULAR | Status: DC | PRN

## 2015-10-02 MED ORDER — ONDANSETRON HCL 4 MG/2ML IJ SOLN
INTRAMUSCULAR | Status: AC
  Filled 2015-10-02: qty 2

## 2015-10-02 MED ORDER — MIDAZOLAM HCL 5 MG/5ML IJ SOLN
INTRAMUSCULAR | Status: DC | PRN
  Administered 2015-10-02: 2 mg via INTRAVENOUS

## 2015-10-02 MED ORDER — LACTATED RINGERS IV SOLN
INTRAVENOUS | Status: DC | PRN
  Administered 2015-10-02 (×2): via INTRAVENOUS

## 2015-10-02 MED ORDER — ERTAPENEM SODIUM 1 G IJ SOLR
1.0000 g | INTRAMUSCULAR | Status: DC
  Administered 2015-10-02 – 2015-10-14 (×13): 1 g via INTRAVENOUS
  Filled 2015-10-02 (×14): qty 1

## 2015-10-02 MED ORDER — PHENYLEPHRINE HCL 10 MG/ML IJ SOLN
INTRAMUSCULAR | Status: DC | PRN
  Administered 2015-10-02 (×3): 80 ug via INTRAVENOUS

## 2015-10-02 MED ORDER — LACTATED RINGERS IV SOLN
INTRAVENOUS | Status: DC | PRN
  Administered 2015-10-02: 3000 mL

## 2015-10-02 MED ORDER — SUCCINYLCHOLINE CHLORIDE 20 MG/ML IJ SOLN
INTRAMUSCULAR | Status: DC | PRN
  Administered 2015-10-02: 100 mg via INTRAVENOUS

## 2015-10-02 MED ORDER — LIDOCAINE HCL (CARDIAC) 20 MG/ML IV SOLN
INTRAVENOUS | Status: AC
  Filled 2015-10-02: qty 5

## 2015-10-02 MED ORDER — ROCURONIUM BROMIDE 100 MG/10ML IV SOLN
INTRAVENOUS | Status: AC
  Filled 2015-10-02: qty 1

## 2015-10-02 MED ORDER — SUGAMMADEX SODIUM 200 MG/2ML IV SOLN
INTRAVENOUS | Status: DC | PRN
  Administered 2015-10-02: 200 mg via INTRAVENOUS

## 2015-10-02 MED ORDER — FENTANYL CITRATE (PF) 250 MCG/5ML IJ SOLN
INTRAMUSCULAR | Status: AC
  Filled 2015-10-02: qty 5

## 2015-10-02 MED ORDER — BUPIVACAINE HCL (PF) 0.5 % IJ SOLN
INTRAMUSCULAR | Status: AC
  Filled 2015-10-02: qty 30

## 2015-10-02 MED ORDER — PROPOFOL 10 MG/ML IV BOLUS
INTRAVENOUS | Status: AC
  Filled 2015-10-02: qty 20

## 2015-10-02 MED ORDER — METHOCARBAMOL 1000 MG/10ML IJ SOLN
1000.0000 mg | Freq: Four times a day (QID) | INTRAVENOUS | Status: DC | PRN
  Administered 2015-10-05: 1000 mg via INTRAVENOUS
  Filled 2015-10-02 (×4): qty 10

## 2015-10-02 MED ORDER — PROPOFOL 10 MG/ML IV BOLUS
INTRAVENOUS | Status: DC | PRN
  Administered 2015-10-02: 200 mg via INTRAVENOUS

## 2015-10-02 MED ORDER — FAMOTIDINE IN NACL 20-0.9 MG/50ML-% IV SOLN
20.0000 mg | INTRAVENOUS | Status: DC
  Administered 2015-10-02 – 2015-10-10 (×9): 20 mg via INTRAVENOUS
  Filled 2015-10-02 (×10): qty 50

## 2015-10-02 MED ORDER — BUPIVACAINE HCL (PF) 0.5 % IJ SOLN
INTRAMUSCULAR | Status: DC | PRN
  Administered 2015-10-02: 10 mL

## 2015-10-02 MED ORDER — MEPERIDINE HCL 50 MG/ML IJ SOLN: 6.2500 mg | INTRAMUSCULAR | Status: DC | PRN

## 2015-10-02 MED ORDER — ROCURONIUM BROMIDE 100 MG/10ML IV SOLN
INTRAVENOUS | Status: DC | PRN
  Administered 2015-10-02: 20 mg via INTRAVENOUS

## 2015-10-02 MED ORDER — MIDAZOLAM HCL 2 MG/2ML IJ SOLN
INTRAMUSCULAR | Status: AC
  Filled 2015-10-02: qty 2

## 2015-10-02 MED ORDER — GLYCOPYRROLATE 0.2 MG/ML IJ SOLN
INTRAMUSCULAR | Status: AC
  Filled 2015-10-02: qty 3

## 2015-10-02 MED ORDER — LIDOCAINE HCL (CARDIAC) 20 MG/ML IV SOLN
INTRAVENOUS | Status: DC | PRN
  Administered 2015-10-02: 60 mg via INTRAVENOUS

## 2015-10-02 MED ORDER — PHENYLEPHRINE 40 MCG/ML (10ML) SYRINGE FOR IV PUSH (FOR BLOOD PRESSURE SUPPORT)
PREFILLED_SYRINGE | INTRAVENOUS | Status: AC
  Filled 2015-10-02: qty 10

## 2015-10-02 MED ORDER — FENTANYL CITRATE (PF) 100 MCG/2ML IJ SOLN
INTRAMUSCULAR | Status: DC | PRN
  Administered 2015-10-02 (×2): 50 ug via INTRAVENOUS
  Administered 2015-10-02: 100 ug via INTRAVENOUS
  Administered 2015-10-02: 50 ug via INTRAVENOUS

## 2015-10-02 MED ORDER — HYDROMORPHONE HCL 1 MG/ML IJ SOLN
INTRAMUSCULAR | Status: AC
  Filled 2015-10-02: qty 1

## 2015-10-02 MED ORDER — 0.9 % SODIUM CHLORIDE (POUR BTL) OPTIME
TOPICAL | Status: DC | PRN
  Administered 2015-10-02: 1000 mL

## 2015-10-02 MED ORDER — HYDROMORPHONE HCL 1 MG/ML IJ SOLN
INTRAMUSCULAR | Status: DC | PRN
  Administered 2015-10-02: 1 mg via INTRAVENOUS

## 2015-10-02 SURGICAL SUPPLY — 58 items
APPLIER CLIP 5 13 M/L LIGAMAX5 (MISCELLANEOUS)
APPLIER CLIP ROT 10 11.4 M/L (STAPLE)
BLADE EXTENDED COATED 6.5IN (ELECTRODE) IMPLANT
BLADE HEX COATED 2.75 (ELECTRODE) ×3 IMPLANT
CABLE HIGH FREQUENCY MONO STRZ (ELECTRODE) ×3 IMPLANT
CELLS DAT CNTRL 66122 CELL SVR (MISCELLANEOUS) IMPLANT
CLIP APPLIE 5 13 M/L LIGAMAX5 (MISCELLANEOUS) IMPLANT
CLIP APPLIE ROT 10 11.4 M/L (STAPLE) IMPLANT
COVER SURGICAL LIGHT HANDLE (MISCELLANEOUS) ×3 IMPLANT
DECANTER SPIKE VIAL GLASS SM (MISCELLANEOUS) ×3 IMPLANT
DISSECTOR BLUNT TIP ENDO 5MM (MISCELLANEOUS) IMPLANT
DRAPE LAPAROSCOPIC ABDOMINAL (DRAPES) ×3 IMPLANT
ELECT REM PT RETURN 9FT ADLT (ELECTROSURGICAL) ×3
ELECTRODE REM PT RTRN 9FT ADLT (ELECTROSURGICAL) ×1 IMPLANT
FILTER SMOKE EVAC LAPAROSHD (FILTER) IMPLANT
GAUZE SPONGE 4X4 12PLY STRL (GAUZE/BANDAGES/DRESSINGS) IMPLANT
GLOVE BIOGEL PI IND STRL 7.0 (GLOVE) ×1 IMPLANT
GLOVE BIOGEL PI INDICATOR 7.0 (GLOVE) ×2
GLOVE ECLIPSE 8.0 STRL XLNG CF (GLOVE) ×6 IMPLANT
GLOVE INDICATOR 8.0 STRL GRN (GLOVE) ×6 IMPLANT
GOWN STRL REUS W/TWL LRG LVL3 (GOWN DISPOSABLE) ×6 IMPLANT
GOWN STRL REUS W/TWL XL LVL3 (GOWN DISPOSABLE) ×6 IMPLANT
LEGGING LITHOTOMY PAIR STRL (DRAPES) IMPLANT
LIGASURE IMPACT 36 18CM CVD LR (INSTRUMENTS) IMPLANT
NS IRRIG 1000ML POUR BTL (IV SOLUTION) ×6 IMPLANT
PACK COLON (CUSTOM PROCEDURE TRAY) ×3 IMPLANT
PAD POSITIONING PINK XL (MISCELLANEOUS) ×3 IMPLANT
POSITIONER SURGICAL ARM (MISCELLANEOUS) IMPLANT
RTRCTR WOUND ALEXIS 18CM MED (MISCELLANEOUS)
SCISSORS LAP 5X35 DISP (ENDOMECHANICALS) ×3 IMPLANT
SET IRRIG TUBING LAPAROSCOPIC (IRRIGATION / IRRIGATOR) IMPLANT
SHEARS HARMONIC ACE PLUS 36CM (ENDOMECHANICALS) IMPLANT
SLEEVE XCEL OPT CAN 5 100 (ENDOMECHANICALS) ×9 IMPLANT
SOLUTION ANTI FOG 6CC (MISCELLANEOUS) ×3 IMPLANT
STAPLER VISISTAT 35W (STAPLE) ×3 IMPLANT
SUT PDS AB 1 CTX 36 (SUTURE) IMPLANT
SUT PDS AB 1 TP1 96 (SUTURE) IMPLANT
SUT PROLENE 2 0 SH DA (SUTURE) IMPLANT
SUT SILK 2 0 (SUTURE) ×2
SUT SILK 2 0 SH CR/8 (SUTURE) ×3 IMPLANT
SUT SILK 2-0 18XBRD TIE 12 (SUTURE) ×1 IMPLANT
SUT SILK 3 0 (SUTURE) ×2
SUT SILK 3 0 SH CR/8 (SUTURE) ×3 IMPLANT
SUT SILK 3-0 18XBRD TIE 12 (SUTURE) ×1 IMPLANT
SUT VICRYL 2 0 18  UND BR (SUTURE) ×2
SUT VICRYL 2 0 18 UND BR (SUTURE) ×1 IMPLANT
SYS LAPSCP GELPORT 120MM (MISCELLANEOUS)
SYSTEM LAPSCP GELPORT 120MM (MISCELLANEOUS) IMPLANT
TAPE CLOTH 4X10 WHT NS (GAUZE/BANDAGES/DRESSINGS) IMPLANT
TRAY FOLEY W/METER SILVER 14FR (SET/KITS/TRAYS/PACK) IMPLANT
TRAY FOLEY W/METER SILVER 16FR (SET/KITS/TRAYS/PACK) ×3 IMPLANT
TROCAR BLADELESS OPT 5 100 (ENDOMECHANICALS) IMPLANT
TROCAR BLADELESS OPT 5 75 (ENDOMECHANICALS) ×3 IMPLANT
TROCAR XCEL BLUNT TIP 100MML (ENDOMECHANICALS) IMPLANT
TROCAR XCEL NON-BLD 11X100MML (ENDOMECHANICALS) IMPLANT
TROCAR XCEL UNIV SLVE 11M 100M (ENDOMECHANICALS) IMPLANT
TUBING INSUF HEATED (TUBING) ×3 IMPLANT
YANKAUER SUCT BULB TIP NO VENT (SUCTIONS) ×3 IMPLANT

## 2015-10-02 NOTE — Progress Notes (Signed)
Patient continues to have  Elevated temp.  He's on antibiotic, on Fluids, MD aware.  Sepsis protocol done on admission.  No new order from MD at this time.

## 2015-10-02 NOTE — Op Note (Signed)
Operative Note  Eddie Foley male 60 y.o. 10/02/2015  PREOPERATIVE DX:  Complicated sigmoid diverticulitis with intra-abdominal abscess  POSTOPERATIVE DX:  Same  PROCEDURE:   Laparoscopic drainage of abscess, intraperitoneal lavage, placement of drains.         Surgeon: Odis Hollingshead   Assistants: Melina Modena, PA  Anesthesia: General endotracheal anesthesia  Indications:   This is a 60 year old male with complicated sigmoid diverticulitis and abscess that has failed medical management. He now presents for the above procedure.    Procedure Detail:  He was brought to the operating room placed supine on the operating table and a general anesthetic was administered. A Foley catheter was inserted. An oral gastric tube was inserted. The abdominal wall was widely sterilely prepped and draped and timeout was performed.  He was placed in slight reverse Trendelenburg position. A 5 mm incision was made left upper quadrant subcostal region. Using a 5 mm Optiview trocar and 5 mm laparoscope, access was gained into the peritoneal cavity and pneumoperitoneum created. Inspection of the area underneath the trocar demonstrated no evidence of organ injury or bleeding. A 5 mm trocar was then placed in the right upper quadrant. A 5 mm trocar was placed in the right lower quadrant.  The abdominal cavity was inspected. There is no pus in the pelvis. No pus in the right upper quadrant or right lower quadrant. No pus in the left upper quadrant.  No pus in the left lower quadrant.The descending colon was identified and traced distally until area of inflammatory change was noted in the proximal to mid sigmoid colon. This appeared to be a short segment.. There is noted to be at area of small intestine adherent to an inflamed area of sigmoid colon.  This was separated bluntly. Normal distal sigmoid colon was identified. Oozing blunt dissection, I unroofed an abscess cavity and drained pus from it. There is no  feculent material or feculent drainage following this. I opened up the abscess cavity bluntly. I milked the colon proximal to this area to see if I could find a hole or leak and none was found. I then copiously lavaged this area as well as a left gutter and pelvis with 3 L of saline solution.  Following this, a 5 mm trocar was placed in the left lower quadrant. A #19 Blake drain was then placed through the right lower quadrant trocar site and positioned so it was going down into the pelvis and the tip was in the abscess cavity. A second #19 Blake drain was placed through the left lower quadrant trocar site and positioned in the left gutter and then just superior to the abscess cavity. Both drains were anchored to the skin with 2-0 nylon suture.  4 quadrant inspection was performed and there is no evidence of organ injury or bleeding.  The trocars were then removed and the CO2 gas released. Drains were hooked up to closed suction. Trocar sites were then closed with 4-0 Monocryl subcuticular stitches. Steri-Strips and sterile dressings were applied.  He tolerated the procedure well without any apparent complications and was taken to the recovery room in satisfactory condition. He'll be switched to intravenous Invanz. If he fails this management, he will need a Hartmann procedure.   Estimated Blood Loss:  less than 100 mL         Drains: Two # 19 Blake drains         Specimens: none        Complications:  * No  complications entered in OR log *         Disposition: PACU - hemodynamically stable.         Condition: stable

## 2015-10-02 NOTE — Transfer of Care (Signed)
Immediate Anesthesia Transfer of Care Note  Patient: Eddie Foley  Procedure(s) Performed: Procedure(s): LAPAROSCOPIC DRAINAGE OF ABDOMINAL ABSCESS, LAVAGE, PLACEMENT OF DRAINS (N/A)  Patient Location: PACU  Anesthesia Type:General  Level of Consciousness: awake, alert  and oriented  Airway & Oxygen Therapy: Patient Spontanous Breathing and Patient connected to face mask oxygen  Post-op Assessment: Report given to RN and Post -op Vital signs reviewed and stable  Post vital signs: Reviewed and stable  Last Vitals:  Filed Vitals:   10/02/15 1000 10/02/15 1005  BP: 141/74 135/65  Pulse: 37 115  Temp:    Resp:      Last Pain:  Filed Vitals:   10/02/15 1007  PainSc: Asleep      Patients Stated Pain Goal: 4 ([id number])  Complications: No apparent anesthesia complications

## 2015-10-02 NOTE — Anesthesia Procedure Notes (Signed)
Procedure Name: Intubation Date/Time: 10/02/2015 10:26 AM Performed by: Dimas Millin, Delitha Elms F Pre-anesthesia Checklist: Patient identified, Emergency Drugs available, Suction available, Patient being monitored and Timeout performed Patient Re-evaluated:Patient Re-evaluated prior to inductionOxygen Delivery Method: Circle system utilized Preoxygenation: Pre-oxygenation with 100% oxygen Intubation Type: IV induction Ventilation: Mask ventilation without difficulty Laryngoscope Size: Miller and 2 Grade View: Grade I Tube type: Oral Tube size: 7.5 mm Number of attempts: 1 Airway Equipment and Method: Stylet Placement Confirmation: ETT inserted through vocal cords under direct vision,  positive ETCO2 and breath sounds checked- equal and bilateral Secured at: 22.5 cm Tube secured with: Tape Dental Injury: Teeth and Oropharynx as per pre-operative assessment

## 2015-10-02 NOTE — Anesthesia Postprocedure Evaluation (Signed)
Anesthesia Post Note  Patient: GERMAN CHAKRABORTY  Procedure(s) Performed: Procedure(s) (LRB): LAPAROSCOPIC DRAINAGE OF ABDOMINAL ABSCESS, LAVAGE, PLACEMENT OF DRAINS (N/A)  Patient location during evaluation: PACU Anesthesia Type: General Level of consciousness: awake and alert Pain management: pain level controlled Vital Signs Assessment: post-procedure vital signs reviewed and stable Respiratory status: spontaneous breathing, nonlabored ventilation, respiratory function stable and patient connected to nasal cannula oxygen Cardiovascular status: blood pressure returned to baseline and stable Postop Assessment: no signs of nausea or vomiting Anesthetic complications: no    Last Vitals:  Filed Vitals:   10/02/15 1400 10/02/15 1500  BP: 143/77 159/88  Pulse: 82 42  Temp: 36.8 C 36.9 C  Resp: 16 16    Last Pain:  Filed Vitals:   10/02/15 1516  PainSc: Asleep                 Effie Berkshire

## 2015-10-02 NOTE — Anesthesia Preprocedure Evaluation (Addendum)
Anesthesia Evaluation  Patient identified by MRN, date of birth, ID band Patient awake    Reviewed: Allergy & Precautions, NPO status , Patient's Chart, lab work & pertinent test results  Airway Mallampati: III  TM Distance: >3 FB Neck ROM: Full    Dental  (+) Teeth Intact   Pulmonary sleep apnea ,  Reactive airways disease   Pulmonary exam normal breath sounds clear to auscultation + decreased breath sounds      Cardiovascular  Rhythm:Regular Rate:Tachycardia  EKG- Sinus Tachycardia Right axis deviation, incomplete RBBB pattern   Neuro/Psych Hereditary and acquired peripheral neuropathy-unspecified  Neuromuscular disease negative psych ROS   GI/Hepatic Neg liver ROS, GERD  Medicated and Controlled,Perforated sigmoid diverticulum with abscess   Endo/Other  Morbid obesity  Renal/GU negative Renal ROS  negative genitourinary   Musculoskeletal negative musculoskeletal ROS (+)   Abdominal (+) + obese,  Abdomen: tender.    Peds  Hematology   Anesthesia Other Findings   Reproductive/Obstetrics                            Anesthesia Physical Anesthesia Plan  ASA: III  Anesthesia Plan: General   Post-op Pain Management:    Induction: Intravenous, Rapid sequence and Cricoid pressure planned  Airway Management Planned: Oral ETT  Additional Equipment:   Intra-op Plan:   Post-operative Plan: Extubation in OR  Informed Consent: I have reviewed the patients History and Physical, chart, labs and discussed the procedure including the risks, benefits and alternatives for the proposed anesthesia with the patient or authorized representative who has indicated his/her understanding and acceptance.   Dental advisory given  Plan Discussed with: CRNA, Anesthesiologist and Surgeon  Anesthesia Plan Comments:         Anesthesia Quick Evaluation

## 2015-10-02 NOTE — Progress Notes (Signed)
Subjective: He does not feel any better today.  Objective: Vital signs in last 24 hours: Temp:  [97.9 F (36.6 C)-102.2 F (39 C)] 99.3 F (37.4 C) (06/02 0520) Pulse Rate:  [110-119] 114 (06/02 0520) Resp:  [16-18] 18 (06/02 0520) BP: (134-139)/(72-85) 134/75 mmHg (06/02 0520) SpO2:  [97 %-98 %] 98 % (06/02 0520) Last BM Date: 09/29/15  Intake/Output from previous day: 06/01 0701 - 06/02 0700 In: 3592.5 [P.O.:230; I.V.:3312.5; IV Piggyback:50] Out: 1975 [id number] Intake/Output this shift:    PE: General- In NAD Abdomen-soft, minimal lower abdominal tenderness  Lab Results:   Recent Labs  10/01/15 0534 10/02/15 0528  WBC 14.6* 15.4*  HGB 12.6* 12.4*  HCT 38.6* 38.5*  PLT 244 265   BMET  Recent Labs  09/30/15 0506 10/01/15 0534  NA 137 136  K 4.3 3.7  CL 105 108  CO2 24 23  GLUCOSE 138* 173*  BUN 13 10  CREATININE 1.14 1.24  CALCIUM 8.4* 7.9*   PT/INR No results for input(s): LABPROT, INR in the last 72 hours. Comprehensive Metabolic Panel:    Component Value Date/Time   NA 136 10/01/2015 0534   NA 137 09/30/2015 0506   NA 145* 02/21/2015 0907   NA 142 07/18/2014 1016   K 3.7 10/01/2015 0534   K 4.3 09/30/2015 0506   CL 108 10/01/2015 0534   CL 105 09/30/2015 0506   CO2 23 10/01/2015 0534   CO2 24 09/30/2015 0506   BUN 10 10/01/2015 0534   BUN 13 09/30/2015 0506   BUN 12 02/21/2015 0907   BUN 15 07/18/2014 1016   CREATININE 1.24 10/01/2015 0534   CREATININE 1.14 09/30/2015 0506   GLUCOSE 173* 10/01/2015 0534   GLUCOSE 138* 09/30/2015 0506   GLUCOSE 106* 02/21/2015 0907   GLUCOSE 101* 07/18/2014 1016   CALCIUM 7.9* 10/01/2015 0534   CALCIUM 8.4* 09/30/2015 0506   AST 24 09/29/2015 1845   AST 22 09/29/2015 1625   ALT 22 09/29/2015 1845   ALT 23 09/29/2015 1625   ALKPHOS 62 09/29/2015 1845   ALKPHOS 63 09/29/2015 1625   BILITOT 0.7 09/29/2015 1845   BILITOT 0.8 09/29/2015 1625   BILITOT 0.2 02/21/2015 0907   PROT 7.5  09/29/2015 1845   PROT 7.3 09/29/2015 1625   PROT 6.2 02/21/2015 0907   ALBUMIN 3.6 09/29/2015 1845   ALBUMIN 3.5 09/29/2015 1625   ALBUMIN 4.1 02/21/2015 0907     Studies/Results: No results found.  Anti-infectives: Anti-infectives    Start     Dose/Rate Route Frequency Ordered Stop   09/29/15 2230  piperacillin-tazobactam (ZOSYN) IVPB 3.375 g     3.375 g 12.5 mL/hr over 240 Minutes Intravenous Every 8 hours 09/29/15 2217     09/29/15 2015  levofloxacin (LEVAQUIN) IVPB 750 mg     750 mg 100 mL/hr over 90 Minutes Intravenous  Once 09/29/15 2002 09/29/15 2148   09/29/15 2015  metroNIDAZOLE (FLAGYL) IVPB 500 mg     500 mg 100 mL/hr over 60 Minutes Intravenous  Once 09/29/15 2002 09/29/15 2203      Assessment Active Problems:   Diverticulitis of large intestine with perforation and abscess-fever spiking again and WBC rising so he is failing medical management    LOS: 3 days   Plan:  OR for laparoscopic drainage of abscess, lavage, possible partial colectomy and colostomy.  I have explained the procedure and risks to him and his wife.  Risks include but are not limited to bleeding, infection, wound problems, anesthesia,  need for colostomy, need for reoperative surgery (partial colectomy and colostomy during this hospitalization),  injury to intraabominal organs (such as intestine, spleen, kidney, bladder, ureter, etc.).  He seems to understand and agrees to proceed.   Eddie Foley 10/02/2015

## 2015-10-03 ENCOUNTER — Encounter (HOSPITAL_COMMUNITY): Payer: Self-pay | Admitting: General Surgery

## 2015-10-03 LAB — CBC
HEMATOCRIT: 38.4 % — AB (ref 39.0–52.0)
HEMOGLOBIN: 12.5 g/dL — AB (ref 13.0–17.0)
MCH: 29.9 pg (ref 26.0–34.0)
MCHC: 32.6 g/dL (ref 30.0–36.0)
MCV: 91.9 fL (ref 78.0–100.0)
Platelets: 296 10*3/uL (ref 150–400)
RBC: 4.18 MIL/uL — ABNORMAL LOW (ref 4.22–5.81)
RDW: 14 % (ref 11.5–15.5)
WBC: 11.8 10*3/uL — ABNORMAL HIGH (ref 4.0–10.5)

## 2015-10-03 LAB — BASIC METABOLIC PANEL
ANION GAP: 7 (ref 5–15)
BUN: 9 mg/dL (ref 6–20)
CO2: 23 mmol/L (ref 22–32)
Calcium: 7.6 mg/dL — ABNORMAL LOW (ref 8.9–10.3)
Chloride: 106 mmol/L (ref 101–111)
Creatinine, Ser: 1.05 mg/dL (ref 0.61–1.24)
GFR calc Af Amer: >60 mL/min (ref >60)
GFR calc non Af Amer: >60 mL/min (ref >60)
GLUCOSE: 166 mg/dL — AB (ref 65–99)
POTASSIUM: 3.6 mmol/L (ref 3.5–5.1)
Sodium: 136 mmol/L (ref 135–145)

## 2015-10-03 LAB — GLUCOSE, CAPILLARY
GLUCOSE-CAPILLARY: 165 mg/dL — AB (ref 65–99)
GLUCOSE-CAPILLARY: 163 mg/dL — AB (ref 65–99)
Glucose-Capillary: 163 mg/dL — ABNORMAL HIGH (ref 65–99)

## 2015-10-03 MED ORDER — HYDROMORPHONE HCL 1 MG/ML IJ SOLN
1.0000 mg | INTRAMUSCULAR | Status: DC | PRN
  Administered 2015-10-03 – 2015-10-05 (×17): 1 mg via INTRAVENOUS
  Filled 2015-10-03 (×17): qty 1

## 2015-10-03 NOTE — Anesthesia Postprocedure Evaluation (Signed)
Anesthesia Post Note  Patient: Eddie Foley  Procedure(s) Performed: Procedure(s) (LRB): LAPAROSCOPIC DRAINAGE OF ABDOMINAL ABSCESS, LAVAGE, PLACEMENT OF DRAINS (N/A)  Patient location during evaluation: PACU Anesthesia Type: General Level of consciousness: awake and alert and oriented Pain management: pain level controlled Vital Signs Assessment: post-procedure vital signs reviewed and stable Respiratory status: spontaneous breathing, nonlabored ventilation, patient connected to nasal cannula oxygen and respiratory function stable Cardiovascular status: blood pressure returned to baseline, stable and tachycardic Postop Assessment: no signs of nausea or vomiting Anesthetic complications: no                 Deantre Bourdon A.

## 2015-10-03 NOTE — Progress Notes (Signed)
1 Day Post-Op  Subjective: Pain is mild to moderate mostly on the right side Having nausea with the morphine' No flatus  Objective: Vital signs in last 24 hours: Temp:  [98.2 F (36.8 C)-101.7 F (38.7 C)] 98.4 F (36.9 C) (06/03 0521) Pulse Rate:  [37-124] 39 (06/03 0521) Resp:  [13-21] 16 (06/03 0521) BP: (121-182)/(59-94) 127/73 mmHg (06/03 0521) SpO2:  [92 %-100 %] 99 % (06/03 0521) Last BM Date: 09/29/15  Intake/Output from previous day: 06/02 0701 - 06/03 0700 In: 4400 [P.O.:100; I.V.:4300] Out: 2840 [Urine:2225; Drains:465; Blood:150] Intake/Output this shift:    Abdomen obese, mildly distended. Drains are serous without purulence or stool  Lab Results:   Recent Labs  10/02/15 0528 10/03/15 0523  WBC 15.4* 11.8*  HGB 12.4* 12.5*  HCT 38.5* 38.4*  PLT 265 296   BMET  Recent Labs  10/01/15 0534 10/03/15 0523  NA 136 136  K 3.7 3.6  CL 108 106  CO2 23 23  GLUCOSE 173* 166*  BUN 10 9  CREATININE 1.24 1.05  CALCIUM 7.9* 7.6*   PT/INR No results for input(s): LABPROT, INR in the last 72 hours. ABG No results for input(s): PHART, HCO3 in the last 72 hours.  Invalid input(s): PCO2, PO2  Studies/Results: No results found.  Anti-infectives: Anti-infectives    Start     Dose/Rate Route Frequency Ordered Stop   10/02/15 1400  ertapenem (INVANZ) 1 g in sodium chloride 0.9 % 50 mL IVPB     1 g 100 mL/hr over 30 Minutes Intravenous Every 24 hours 10/02/15 1151     09/29/15 2230  [MAR Hold]  piperacillin-tazobactam (ZOSYN) IVPB 3.375 g  Status:  Discontinued     (MAR Hold since 10/02/15 0957)   3.375 g 12.5 mL/hr over 240 Minutes Intravenous Every 8 hours 09/29/15 2217 10/02/15 1151   09/29/15 2015  levofloxacin (LEVAQUIN) IVPB 750 mg     750 mg 100 mL/hr over 90 Minutes Intravenous  Once 09/29/15 2002 09/29/15 2148   09/29/15 2015  metroNIDAZOLE (FLAGYL) IVPB 500 mg     500 mg 100 mL/hr over 60 Minutes Intravenous  Once 09/29/15 2002 09/29/15 2203       Assessment/Plan: s/p Procedure(s): LAPAROSCOPIC DRAINAGE OF ABDOMINAL ABSCESS, LAVAGE, PLACEMENT OF DRAINS (N/A)  Diverticulitis with abscess  Clinically stable WBC improving  Will continue IV Antibiotics and drains  LOS: 4 days    Gilmer Kaminsky A 10/03/2015

## 2015-10-04 LAB — CBC WITH DIFFERENTIAL/PLATELET
Basophils Absolute: 0 10*3/uL (ref 0.0–0.1)
Basophils Relative: 0 %
Eosinophils Absolute: 0.9 10*3/uL — ABNORMAL HIGH (ref 0.0–0.7)
Eosinophils Relative: 6 %
HEMATOCRIT: 38.3 % — AB (ref 39.0–52.0)
HEMOGLOBIN: 13 g/dL (ref 13.0–17.0)
LYMPHS ABS: 1.5 10*3/uL (ref 0.7–4.0)
Lymphocytes Relative: 10 %
MCH: 30.1 pg (ref 26.0–34.0)
MCHC: 33.9 g/dL (ref 30.0–36.0)
MCV: 88.7 fL (ref 78.0–100.0)
MONOS PCT: 9 %
Monocytes Absolute: 1.3 10*3/uL — ABNORMAL HIGH (ref 0.1–1.0)
NEUTROS ABS: 11.2 10*3/uL — AB (ref 1.7–7.7)
NEUTROS PCT: 75 %
Platelets: 300 10*3/uL (ref 150–400)
RBC: 4.32 MIL/uL (ref 4.22–5.81)
RDW: 13.6 % (ref 11.5–15.5)
WBC: 15 10*3/uL — ABNORMAL HIGH (ref 4.0–10.5)

## 2015-10-04 LAB — CBC
HEMATOCRIT: 37.8 % — AB (ref 39.0–52.0)
HEMOGLOBIN: 12.5 g/dL — AB (ref 13.0–17.0)
MCH: 29.4 pg (ref 26.0–34.0)
MCHC: 33.1 g/dL (ref 30.0–36.0)
MCV: 88.9 fL (ref 78.0–100.0)
Platelets: 306 10*3/uL (ref 150–400)
RBC: 4.25 MIL/uL (ref 4.22–5.81)
RDW: 13.7 % (ref 11.5–15.5)
WBC: 15 10*3/uL — ABNORMAL HIGH (ref 4.0–10.5)

## 2015-10-04 LAB — URINE MICROSCOPIC-ADD ON
Bacteria, UA: NONE SEEN
Squamous Epithelial / LPF: NONE SEEN

## 2015-10-04 LAB — GLUCOSE, CAPILLARY: GLUCOSE-CAPILLARY: 167 mg/dL — AB (ref 65–99)

## 2015-10-04 LAB — URINALYSIS, ROUTINE W REFLEX MICROSCOPIC
BILIRUBIN URINE: NEGATIVE
Glucose, UA: 500 mg/dL — AB
Ketones, ur: NEGATIVE mg/dL
LEUKOCYTES UA: NEGATIVE
NITRITE: NEGATIVE
PH: 7 (ref 5.0–8.0)
Protein, ur: 30 mg/dL — AB
SPECIFIC GRAVITY, URINE: 1.018 (ref 1.005–1.030)

## 2015-10-04 NOTE — Progress Notes (Signed)
2 Days Post-Op  Subjective: Having less pain today Foley replaced for retention  Objective: Vital signs in last 24 hours: Temp:  [98.2 F (36.8 C)-100 F (37.8 C)] 98.2 F (36.8 C) (06/04 0505) Pulse Rate:  [34-105] 34 (06/04 0505) Resp:  [16-20] 20 (06/04 0505) BP: (141-174)/(70-82) 146/79 mmHg (06/04 0505) SpO2:  [98 %-100 %] 98 % (06/04 0505) Last BM Date: 09/29/15  Intake/Output from previous day: 06/03 0701 - 06/04 0700 In: 2040 [P.O.:240; I.V.:1800] Out: [id number] [Urine:4825; Drains:937] Intake/Output this shift:    Abdomen soft, obese, less tender Drains serosang  Lab Results:   Recent Labs  10/03/15 0523 10/04/15 0523  WBC 11.8* 15.0*  HGB 12.5* 12.5*  HCT 38.4* 37.8*  PLT 296 306   BMET  Recent Labs  10/03/15 0523  NA 136  K 3.6  CL 106  CO2 23  GLUCOSE 166*  BUN 9  CREATININE 1.05  CALCIUM 7.6*   PT/INR No results for input(s): LABPROT, INR in the last 72 hours. ABG No results for input(s): PHART, HCO3 in the last 72 hours.  Invalid input(s): PCO2, PO2  Studies/Results: No results found.  Anti-infectives: Anti-infectives    Start     Dose/Rate Route Frequency Ordered Stop   10/02/15 1400  ertapenem (INVANZ) 1 g in sodium chloride 0.9 % 50 mL IVPB     1 g 100 mL/hr over 30 Minutes Intravenous Every 24 hours 10/02/15 1151     09/29/15 2230  [MAR Hold]  piperacillin-tazobactam (ZOSYN) IVPB 3.375 g  Status:  Discontinued     (MAR Hold since 10/02/15 0957)   3.375 g 12.5 mL/hr over 240 Minutes Intravenous Every 8 hours 09/29/15 2217 10/02/15 1151   09/29/15 2015  levofloxacin (LEVAQUIN) IVPB 750 mg     750 mg 100 mL/hr over 90 Minutes Intravenous  Once 09/29/15 2002 09/29/15 2148   09/29/15 2015  metroNIDAZOLE (FLAGYL) IVPB 500 mg     500 mg 100 mL/hr over 60 Minutes Intravenous  Once 09/29/15 2002 09/29/15 2203      Assessment/Plan: s/p Procedure(s): LAPAROSCOPIC DRAINAGE OF ABDOMINAL ABSCESS, LAVAGE, PLACEMENT OF DRAINS (N/A)  WBC  up but clinically better.  May be from urinary retention Will check u/a Start clears Continue IV antibiotics  LOS: 5 days    Eddie Foley A 10/04/2015

## 2015-10-05 LAB — CULTURE, BLOOD (ROUTINE X 2)
CULTURE: NO GROWTH
CULTURE: NO GROWTH

## 2015-10-05 LAB — CBC
HCT: 38.5 % — ABNORMAL LOW (ref 39.0–52.0)
Hemoglobin: 12.8 g/dL — ABNORMAL LOW (ref 13.0–17.0)
MCH: 29.8 pg (ref 26.0–34.0)
MCHC: 33.2 g/dL (ref 30.0–36.0)
MCV: 89.7 fL (ref 78.0–100.0)
Platelets: 308 10*3/uL (ref 150–400)
RBC: 4.29 MIL/uL (ref 4.22–5.81)
RDW: 13.8 % (ref 11.5–15.5)
WBC: 16.3 10*3/uL — ABNORMAL HIGH (ref 4.0–10.5)

## 2015-10-05 LAB — BASIC METABOLIC PANEL
Anion gap: 6 (ref 5–15)
BUN: 8 mg/dL (ref 6–20)
CHLORIDE: 106 mmol/L (ref 101–111)
CO2: 26 mmol/L (ref 22–32)
Calcium: 7.7 mg/dL — ABNORMAL LOW (ref 8.9–10.3)
Creatinine, Ser: 0.89 mg/dL (ref 0.61–1.24)
GLUCOSE: 186 mg/dL — AB (ref 65–99)
Potassium: 3.5 mmol/L (ref 3.5–5.1)
SODIUM: 138 mmol/L (ref 135–145)

## 2015-10-05 LAB — GLUCOSE, CAPILLARY: GLUCOSE-CAPILLARY: 130 mg/dL — AB (ref 65–99)

## 2015-10-05 MED ORDER — BISACODYL 10 MG RE SUPP
10.0000 mg | Freq: Once | RECTAL | Status: AC
  Administered 2015-10-05: 10 mg via RECTAL
  Filled 2015-10-05: qty 1

## 2015-10-05 MED ORDER — TAMSULOSIN HCL 0.4 MG PO CAPS
0.4000 mg | ORAL_CAPSULE | Freq: Every day | ORAL | Status: DC
  Administered 2015-10-05 – 2015-10-08 (×4): 0.4 mg via ORAL
  Filled 2015-10-05 (×6): qty 1

## 2015-10-05 MED ORDER — ACETAMINOPHEN 10 MG/ML IV SOLN
1000.0000 mg | Freq: Four times a day (QID) | INTRAVENOUS | Status: AC
  Administered 2015-10-05 – 2015-10-06 (×4): 1000 mg via INTRAVENOUS
  Filled 2015-10-05 (×4): qty 100

## 2015-10-05 MED ORDER — BETHANECHOL CHLORIDE 25 MG PO TABS
25.0000 mg | ORAL_TABLET | Freq: Three times a day (TID) | ORAL | Status: DC
  Administered 2015-10-05 – 2015-10-08 (×12): 25 mg via ORAL
  Filled 2015-10-05 (×15): qty 1

## 2015-10-05 NOTE — Progress Notes (Signed)
[facility] Progress Note  3 Days Post-Op  Subjective: Nausea without vomiting ?from morphine.  Belching, but no flatus and no BM since 5/30.  Not been OOB much.  Feels bloated and a lot of pain from LLQ to RLQ.  Foley back in due to retention.  Objective: Vital signs in last 24 hours: Temp:  [99 F (37.2 C)-99.3 F (37.4 C)] 99.1 F (37.3 C) (06/05 0700) Pulse Rate:  [91-102] 102 (06/05 0700) Resp:  [18-20] 20 (06/05 0700) BP: (141-163)/(83-98) 154/87 mmHg (06/05 0700) SpO2:  [93 %-96 %] 95 % (06/05 0700) Last BM Date: 09/29/15  Intake/Output from previous day: 06/04 0701 - 06/05 0700 In: 3460 [P.O.:480; I.V.:2980] Out: 2805 [Urine:2345; Drains:460] Intake/Output this shift:    PE: Gen:  Alert, NAD, pleasant Card:  RRR, no M/G/R heard Pulm:  CTA, no W/R/R Abd: Soft, distended, quite tender throughout, +BS, no HSM, incisions C/D/I, RLQ and LLQ drains with serous drainage Ext:  All 4 edematous  Lab Results:   Recent Labs  10/04/15 0900 10/05/15 0743  WBC 15.0* 16.3*  HGB 13.0 12.8*  HCT 38.3* 38.5*  PLT 300 308   BMET  Recent Labs  10/03/15 0523  NA 136  K 3.6  CL 106  CO2 23  GLUCOSE 166*  BUN 9  CREATININE 1.05  CALCIUM 7.6*   PT/INR No results for input(s): LABPROT, INR in the last 72 hours. CMP     Component Value Date/Time   NA 136 10/03/2015 0523   NA 145* 02/21/2015 0907   K 3.6 10/03/2015 0523   CL 106 10/03/2015 0523   CO2 23 10/03/2015 0523   GLUCOSE 166* 10/03/2015 0523   GLUCOSE 106* 02/21/2015 0907   BUN 9 10/03/2015 0523   BUN 12 02/21/2015 0907   CREATININE 1.05 10/03/2015 0523   CALCIUM 7.6* 10/03/2015 0523   PROT 7.5 09/29/2015 1845   PROT 6.2 02/21/2015 0907   ALBUMIN 3.6 09/29/2015 1845   ALBUMIN 4.1 02/21/2015 0907   AST 24 09/29/2015 1845   ALT 22 09/29/2015 1845   ALKPHOS 62 09/29/2015 1845   BILITOT 0.7 09/29/2015 1845   BILITOT 0.2 02/21/2015 0907   GFRNONAA >60 10/03/2015 0523   GFRAA >60  10/03/2015 0523   Lipase     Component Value Date/Time   LIPASE 16 09/29/2015 1845       Studies/Results: No results found.  Anti-infectives: Anti-infectives    Start     Dose/Rate Route Frequency Ordered Stop   10/02/15 1400  ertapenem (INVANZ) 1 g in sodium chloride 0.9 % 50 mL IVPB     1 g 100 mL/hr over 30 Minutes Intravenous Every 24 hours 10/02/15 1151     09/29/15 2230  [MAR Hold]  piperacillin-tazobactam (ZOSYN) IVPB 3.375 g  Status:  Discontinued     (MAR Hold since 10/02/15 0957)   3.375 g 12.5 mL/hr over 240 Minutes Intravenous Every 8 hours 09/29/15 2217 10/02/15 1151   09/29/15 2015  levofloxacin (LEVAQUIN) IVPB 750 mg     750 mg 100 mL/hr over 90 Minutes Intravenous  Once 09/29/15 2002 09/29/15 2148   09/29/15 2015  metroNIDAZOLE (FLAGYL) IVPB 500 mg     500 mg 100 mL/hr over 60 Minutes Intravenous  Once 09/29/15 2002 09/29/15 2203       Assessment/Plan HD #7 Complicated sigmoid diverticulitis with intra-abdominal abscess POD #3 s/p Laparoscopic drainage of abscess, intraperitoneal lavage, placement of drains Leukocytosis - up to 16.3 Post-operative ileus -With increasing WBC, low  grade temps, mild tachycardia keep concern for un-drained, expanding fluid collection, but could just be an ileus and inflammatory changes.  Too early after washout to tell.   -Continues to have high output, but only serous -Check KUB in am -Ambulate and IS -Drain sponge dressing changes -Try dulcolax to help with flatus -Dec IVF to 100/hr, he might be getting a little fluid overloaded.  Urinary retention - foley back in on 10/04/15, add flomax, urecholine, voiding trial Wednesday DVT proph - lovenox FEN - sips/chips while nauseated Disp - continue inpatient     LOS: 6 days    Nat Christen 10/05/2015, 9:22 AM Pager: [phone]  (7am - 4:30pm M-F; 7am - 11:30am Sa/Su)

## 2015-10-05 NOTE — Progress Notes (Signed)
Date:  October 05, 2015 Chart reviewed for concurrent status and case management needs. Will continue to follow the patient for changes and needs:  Pod 3 npo and ivflds at 100cc/hrs, foley intact Expected discharge date: [id number] Eddie Foley, [place], [place], [place]

## 2015-10-06 ENCOUNTER — Encounter (HOSPITAL_COMMUNITY): Payer: Self-pay | Admitting: Radiology

## 2015-10-06 ENCOUNTER — Inpatient Hospital Stay (HOSPITAL_COMMUNITY): Payer: BLUE CROSS/BLUE SHIELD

## 2015-10-06 LAB — BASIC METABOLIC PANEL
ANION GAP: 9 (ref 5–15)
BUN: 8 mg/dL (ref 6–20)
CALCIUM: 7.5 mg/dL — AB (ref 8.9–10.3)
CHLORIDE: 104 mmol/L (ref 101–111)
CO2: 25 mmol/L (ref 22–32)
Creatinine, Ser: 0.91 mg/dL (ref 0.61–1.24)
GFR calc non Af Amer: >60 mL/min (ref >60)
GLUCOSE: 142 mg/dL — AB (ref 65–99)
Potassium: 3.2 mmol/L — ABNORMAL LOW (ref 3.5–5.1)
Sodium: 138 mmol/L (ref 135–145)

## 2015-10-06 LAB — CBC
HEMATOCRIT: 38.8 % — AB (ref 39.0–52.0)
HEMOGLOBIN: 12.7 g/dL — AB (ref 13.0–17.0)
MCH: 29.7 pg (ref 26.0–34.0)
MCHC: 32.7 g/dL (ref 30.0–36.0)
MCV: 90.7 fL (ref 78.0–100.0)
Platelets: 338 10*3/uL (ref 150–400)
RBC: 4.28 MIL/uL (ref 4.22–5.81)
RDW: 14.1 % (ref 11.5–15.5)
WBC: 20.2 10*3/uL — AB (ref 4.0–10.5)

## 2015-10-06 LAB — GLUCOSE, CAPILLARY: Glucose-Capillary: 123 mg/dL — ABNORMAL HIGH (ref 65–99)

## 2015-10-06 MED ORDER — BISACODYL 10 MG RE SUPP
10.0000 mg | Freq: Once | RECTAL | Status: AC
  Administered 2015-10-06: 10 mg via RECTAL
  Filled 2015-10-06: qty 1

## 2015-10-06 MED ORDER — POTASSIUM CHLORIDE 10 MEQ/100ML IV SOLN
10.0000 meq | INTRAVENOUS | Status: AC
Start: 2015-10-06 — End: 2015-10-06
  Administered 2015-10-06 (×4): 10 meq via INTRAVENOUS
  Filled 2015-10-06 (×4): qty 100

## 2015-10-06 MED ORDER — DIATRIZOATE MEGLUMINE & SODIUM 66-10 % PO SOLN
15.0000 mL | ORAL | Status: DC | PRN
  Administered 2015-10-12: 15 mL via ORAL
  Filled 2015-10-06 (×2): qty 30

## 2015-10-06 MED ORDER — IOPAMIDOL (ISOVUE-300) INJECTION 61%
100.0000 mL | Freq: Once | INTRAVENOUS | Status: AC | PRN
  Administered 2015-10-06: 100 mL via INTRAVENOUS

## 2015-10-06 NOTE — Progress Notes (Signed)
[facility] Progress Note  4 Days Post-Op  Subjective: Pt doing better, less pain and distension today.  Walked once yesterday.  Had a BM and passed some flatus with help of dulcolax yesterday.  Not as nauseated.  No vomiting.  Drains less output and clear.  Using IS.    Objective: Vital signs in last 24 hours: Temp:  [98.5 F (36.9 C)-99 F (37.2 C)] 98.8 F (37.1 C) (06/06 0525) Pulse Rate:  [102-106] 102 (06/06 0525) Resp:  [18-20] 20 (06/06 0525) BP: (143-151)/(79-84) 143/84 mmHg (06/06 0525) SpO2:  [92 %-100 %] 96 % (06/06 0525) Last BM Date: 10/06/15  Intake/Output from previous day: 06/05 0701 - 06/06 0700 In: 4375.8 [I.V.:3130.8; IV Piggyback:400] Out: 1585 [Urine:1550; Drains:35] Intake/Output this shift:    PE: Gen: Alert, NAD, pleasant Card: RRR, no M/G/R heard Pulm: CTA, no W/R/R, IS up to 1500 Abd: Soft, distended, quite tender throughout, +BS, no HSM, incisions C/D/I, RLQ and LLQ drains with serous drainage Ext: All 4 edematous  Lab Results:   Recent Labs  10/05/15 0743 10/06/15 0545  WBC 16.3* 20.2*  HGB 12.8* 12.7*  HCT 38.5* 38.8*  PLT 308 338   BMET  Recent Labs  10/05/15 0743 10/06/15 0545  NA 138 138  K 3.5 3.2*  CL 106 104  CO2 26 25  GLUCOSE 186* 142*  BUN 8 8  CREATININE 0.89 0.91  CALCIUM 7.7* 7.5*   PT/INR No results for input(s): LABPROT, INR in the last 72 hours. CMP     Component Value Date/Time   NA 138 10/06/2015 0545   NA 145* 02/21/2015 0907   K 3.2* 10/06/2015 0545   CL 104 10/06/2015 0545   CO2 25 10/06/2015 0545   GLUCOSE 142* 10/06/2015 0545   GLUCOSE 106* 02/21/2015 0907   BUN 8 10/06/2015 0545   BUN 12 02/21/2015 0907   CREATININE 0.91 10/06/2015 0545   CALCIUM 7.5* 10/06/2015 0545   PROT 7.5 09/29/2015 1845   PROT 6.2 02/21/2015 0907   ALBUMIN 3.6 09/29/2015 1845   ALBUMIN 4.1 02/21/2015 0907   AST 24 09/29/2015 1845   ALT 22 09/29/2015 1845   ALKPHOS 62 09/29/2015 1845   BILITOT  0.7 09/29/2015 1845   BILITOT 0.2 02/21/2015 0907   GFRNONAA >60 10/06/2015 0545   GFRAA >60 10/06/2015 0545   Lipase     Component Value Date/Time   LIPASE 16 09/29/2015 1845       Studies/Results: No results found.  Anti-infectives: Anti-infectives    Start     Dose/Rate Route Frequency Ordered Stop   10/02/15 1400  ertapenem (INVANZ) 1 g in sodium chloride 0.9 % 50 mL IVPB     1 g 100 mL/hr over 30 Minutes Intravenous Every 24 hours 10/02/15 1151     09/29/15 2230  [MAR Hold]  piperacillin-tazobactam (ZOSYN) IVPB 3.375 g  Status:  Discontinued     (MAR Hold since 10/02/15 0957)   3.375 g 12.5 mL/hr over 240 Minutes Intravenous Every 8 hours 09/29/15 2217 10/02/15 1151   09/29/15 2015  levofloxacin (LEVAQUIN) IVPB 750 mg     750 mg 100 mL/hr over 90 Minutes Intravenous  Once 09/29/15 2002 09/29/15 2148   09/29/15 2015  metroNIDAZOLE (FLAGYL) IVPB 500 mg     500 mg 100 mL/hr over 60 Minutes Intravenous  Once 09/29/15 2002 09/29/15 2203       Assessment/Plan HD #8 Complicated sigmoid diverticulitis with intra-abdominal abscess POD #4 s/p Laparoscopic drainage of abscess, intraperitoneal  lavage, placement of drains Leukocytosis - up to 20.2 Post-operative ileus -With increasing WBC, low grade temps, mild tachycardia keep concern for un-drained or expanding fluid collection, but could just be an ileus and inflammatory changes.  -Output less 46mL/24hr serous drainage -Ordered CT scan to check for intra-abdominal collections, if abscess found may need IR drain, if normal then advance diet -Ambulate and IS -Drain sponge dressing changes daily -Try dulcolax to help with flatus -Dec IVF to 75/hr, he might be getting a little fluid overloaded.  Hypokalemia - supplemented today Urinary retention - foley back in on 10/04/15, add flomax, urecholine, voiding trial Wednesday DVT proph - lovenox FEN - sips/chips while nauseated Disp - continue inpatient    LOS: 7 days     Nat Christen 10/06/2015, 7:07 AM Pager: [phone]  (7am - 4:30pm M-F; 7am - 11:30am Sa/Su)

## 2015-10-06 NOTE — Progress Notes (Signed)
Radiologist called about concerning CT abdomen results.  Results were relayed to on call MD, Dr. Excell Seltzer, who indicated that he would come to evaluate and talk to patient about results.

## 2015-10-07 ENCOUNTER — Inpatient Hospital Stay (HOSPITAL_COMMUNITY): Payer: BLUE CROSS/BLUE SHIELD

## 2015-10-07 LAB — CBC WITH DIFFERENTIAL/PLATELET
Basophils Absolute: 0 10*3/uL (ref 0.0–0.1)
Basophils Relative: 0 %
EOS PCT: 4 %
Eosinophils Absolute: 0.8 10*3/uL — ABNORMAL HIGH (ref 0.0–0.7)
HEMATOCRIT: 36.4 % — AB (ref 39.0–52.0)
HEMOGLOBIN: 12.2 g/dL — AB (ref 13.0–17.0)
LYMPHS ABS: 1.9 10*3/uL (ref 0.7–4.0)
Lymphocytes Relative: 9 %
MCH: 29.7 pg (ref 26.0–34.0)
MCHC: 33.5 g/dL (ref 30.0–36.0)
MCV: 88.6 fL (ref 78.0–100.0)
MONO ABS: 1.7 10*3/uL — AB (ref 0.1–1.0)
MONOS PCT: 8 %
NEUTROS ABS: 16.7 10*3/uL — AB (ref 1.7–7.7)
Neutrophils Relative %: 79 %
Platelets: 376 10*3/uL (ref 150–400)
RBC: 4.11 MIL/uL — ABNORMAL LOW (ref 4.22–5.81)
RDW: 14.1 % (ref 11.5–15.5)
WBC: 21.1 10*3/uL — ABNORMAL HIGH (ref 4.0–10.5)

## 2015-10-07 LAB — COMPREHENSIVE METABOLIC PANEL
ALK PHOS: 43 U/L (ref 38–126)
ALT: 19 U/L (ref 17–63)
AST: 25 U/L (ref 15–41)
Albumin: 2 g/dL — ABNORMAL LOW (ref 3.5–5.0)
Anion gap: 8 (ref 5–15)
BILIRUBIN TOTAL: 0.9 mg/dL (ref 0.3–1.2)
BUN: 8 mg/dL (ref 6–20)
CALCIUM: 7.5 mg/dL — AB (ref 8.9–10.3)
CO2: 25 mmol/L (ref 22–32)
CREATININE: 0.79 mg/dL (ref 0.61–1.24)
Chloride: 104 mmol/L (ref 101–111)
Glucose, Bld: 153 mg/dL — ABNORMAL HIGH (ref 65–99)
Potassium: 3.2 mmol/L — ABNORMAL LOW (ref 3.5–5.1)
Sodium: 137 mmol/L (ref 135–145)
TOTAL PROTEIN: 5.4 g/dL — AB (ref 6.5–8.1)

## 2015-10-07 LAB — PROTIME-INR
INR: 1.11 (ref 0.00–1.49)
PROTHROMBIN TIME: 14.4 s (ref 11.6–15.2)

## 2015-10-07 LAB — PREALBUMIN: Prealbumin: 4.6 mg/dL — ABNORMAL LOW (ref 18–38)

## 2015-10-07 LAB — GLUCOSE, CAPILLARY: Glucose-Capillary: 148 mg/dL — ABNORMAL HIGH (ref 65–99)

## 2015-10-07 MED ORDER — ACETAMINOPHEN 325 MG PO TABS
650.0000 mg | ORAL_TABLET | Freq: Four times a day (QID) | ORAL | Status: DC
  Administered 2015-10-07 – 2015-10-14 (×24): 650 mg via ORAL
  Filled 2015-10-07 (×25): qty 2

## 2015-10-07 MED ORDER — POTASSIUM CHLORIDE 10 MEQ/100ML IV SOLN
10.0000 meq | INTRAVENOUS | Status: AC
  Administered 2015-10-07 (×4): 10 meq via INTRAVENOUS
  Filled 2015-10-07 (×4): qty 100

## 2015-10-07 MED ORDER — FLUCONAZOLE IN SODIUM CHLORIDE 200-0.9 MG/100ML-% IV SOLN
200.0000 mg | INTRAVENOUS | Status: DC
  Administered 2015-10-07 – 2015-10-14 (×9): 200 mg via INTRAVENOUS
  Filled 2015-10-07 (×9): qty 100

## 2015-10-07 MED ORDER — DEXTROSE 5 % IV SOLN
1000.0000 mg | Freq: Three times a day (TID) | INTRAVENOUS | Status: DC
  Administered 2015-10-07 – 2015-10-14 (×21): 1000 mg via INTRAVENOUS
  Filled 2015-10-07 (×28): qty 10

## 2015-10-07 NOTE — Progress Notes (Signed)
Patient ID: Eddie Foley, male   DOB: Jan 19, 1956, 60 y.o.   MRN: [id number] Request received for CT guided abd abscess drainage on pt. Imaging studies were reviewed by Dr.Hoss. There is no definite abscess to drain on latest scan ,only gas bubbles in RP space. Above d/w CCS PA. Please page Dr. Barbie Banner at [phone] with any additional questions.

## 2015-10-07 NOTE — Progress Notes (Signed)
Talked to Dr. Barbie Banner from IR, the area that the radiologist had read as abscess is not an abscess.  It is actually a collection of gas and maybe phlegmon in the retroperitoneum, but not amenable to perc drainage.  WBC up a bit today to 21.1.  Will add diflucan.  Also will supplement K.  Start clear liquids.  Jomarie Longs, PA-C General Surgery [facility] [phone]

## 2015-10-07 NOTE — Progress Notes (Signed)
Patient ID: Eddie Foley, male   DOB: 05-23-1955, 60 y.o.   MRN: [id number]     [facility]      [address] [place], [place] [address]    Phone: [phone] FAX: [phone]     Subjective: Pain is controlled with pain meds, otherwise its bad. Nausea with pain meds. Having BMs.  Temp 102.  Good uop.   Objective:  Vital signs:  Filed Vitals:   10/06/15 2035 10/06/15 2155 10/06/15 2303 10/07/15 0530  BP:  151/76  135/87  Pulse: 112 114  105  Temp:  102.7 F (39.3 C) 99.7 F (37.6 C) 100 F (37.8 C)  TempSrc:  Oral Oral Oral  Resp: 20 20  18   Height:      Weight:      SpO2: 94% 96%  94%    Last BM Date: 10/06/15  Intake/Output   Yesterday:  06/06 0701 - 06/07 0700 In: 240 [P.O.:240] Out: 1995 [GNOIB:7048; Drains:45] This shift:    I/O last 3 completed shifts: In: 1821.7 [P.O.:240; I.V.:1146.7; Other:35; IV Piggyback:400] Out: 28 [Urine:3500; Drains:80]    Physical Exam: General: Pt awake/alert/oriented x4 in no acute distress Chest: cta.  No chest wall pain w good excursion CV:  Pulses intact.  Regular rhythm MS: Normal AROM mjr joints.  No obvious deformity Abdomen: Soft.  distended.  Incisions are c/d/i. Drains with serous output.   No evidence of peritonitis.  No incarcerated hernias. Ext:  SCDs BLE.  No mjr edema.  No cyanosis Skin: No petechiae / purpura   Problem List:   Active Problems:   Diverticulitis of colon with perforation   Diverticulitis of large intestine with perforation and abscess    Results:   Labs: Results for orders placed or performed during the hospital encounter of 09/29/15 (from the past 48 hour(s))  Glucose, capillary     Status: Abnormal   Collection Time: 10/05/15 10:23 PM  Result Value Ref Range   Glucose-Capillary 130 (H) 65 - 99 mg/dL   Comment 1 Notify RN   CBC     Status: Abnormal   Collection Time: 10/06/15  5:45 AM  Result Value Ref Range   WBC 20.2 (H)  4.0 - 10.5 K/uL   RBC 4.28 4.22 - 5.81 MIL/uL   Hemoglobin 12.7 (L) 13.0 - 17.0 g/dL   HCT 38.8 (L) 39.0 - 52.0 %   MCV 90.7 78.0 - 100.0 fL   MCH 29.7 26.0 - 34.0 pg   MCHC 32.7 30.0 - 36.0 g/dL   RDW 14.1 11.5 - 15.5 %   Platelets 338 150 - 400 K/uL  Basic metabolic panel     Status: Abnormal   Collection Time: 10/06/15  5:45 AM  Result Value Ref Range   Sodium 138 135 - 145 mmol/L   Potassium 3.2 (L) 3.5 - 5.1 mmol/L   Chloride 104 101 - 111 mmol/L   CO2 25 22 - 32 mmol/L   Glucose, Bld 142 (H) 65 - 99 mg/dL   BUN 8 6 - 20 mg/dL   Creatinine, Ser 0.91 0.61 - 1.24 mg/dL   Calcium 7.5 (L) 8.9 - 10.3 mg/dL   GFR calc non Af Amer >60 >60 mL/min   GFR calc Af Amer >60 >60 mL/min    Comment: (NOTE) The eGFR has been calculated using the CKD EPI equation. This calculation has not been validated in all clinical situations. eGFR's persistently <60 mL/min signify possible Chronic Kidney Disease.  Anion gap 9 5 - 15  Glucose, capillary     Status: Abnormal   Collection Time: 10/06/15  9:49 PM  Result Value Ref Range   Glucose-Capillary 123 (H) 65 - 99 mg/dL    Imaging / Studies: Ct Abdomen Pelvis W Contrast  10/06/2015  CLINICAL DATA:  History of sigmoid diverticulitis with intra-abdominal abscess and recent laparoscopic abdominal drainage and drain placement EXAM: CT ABDOMEN AND PELVIS WITH CONTRAST TECHNIQUE: Multidetector CT imaging of the abdomen and pelvis was performed using the standard protocol following bolus administration of intravenous contrast. CONTRAST:  130m ISOVUE-300 IOPAMIDOL (ISOVUE-300) INJECTION 61% COMPARISON:  09/29/2015 FINDINGS: Lung bases demonstrate mild right pleural effusion and right basilar atelectatic changes. The liver is diffusely fatty infiltrated. The gallbladder has been surgically removed. The spleen, adrenal glands and pancreas are within normal limits. The kidneys show no obstructive changes are stable from the prior exam. Stable right lower pole  renal calculus is again seen. The bladder shows evidence of a Foley catheter with near complete decompression. Postsurgical changes are noted with 2 surgical drains in place. I considerable increase in the amount of intraperitoneal air and extraperitoneal air is noted likely related to the laparoscopic procedure. The known abscess is again identified and measures approximately 5.0 by 4.0 cm. This is an increase from the previous exam at which time it measured 3.5 x 2.6 cm. Surgical drains do not reach this collection in a satisfactory fashion. No new definitive abscess is seen. The distal sigmoid colon is within normal limits. No obstructive changes are noted. The osseous structures show no acute abnormality. No pelvic mass lesion or significant free pelvic fluid is noted. IMPRESSION: Considerable increase in the degree of both intra and extraperitoneal air likely related to the recent laparoscopic procedure. The previously seen abscess arising from the the sigmoid colon is again identified and has increased slightly in the interval from the prior exam. The surgical drains appear to be somewhat inferior and anterior to this collection and not adequately reaching the collection. This again remains not amenable to percutaneous drainage. Small right pleural effusion and right basilar atelectasis. These results will be called to the ordering clinician or representative by the Radiologist Assistant, and communication documented in the PACS or zVision Dashboard. Electronically Signed   By: MInez CatalinaM.D.   On: 10/06/2015 17:46   Dg Abd Portable 1v  10/06/2015  CLINICAL DATA:  Follow-up ileus ; history of colon resection on October 02, 2015, first bowel movement in a week just prior to today's abdominal x-ray. EXAM: PORTABLE ABDOMEN - 1 VIEW COMPARISON:  Abdominal and pelvic CT scan of May 30th 2017 FINDINGS: There is a moderate amount of gas within normal calibered small bowel. There is gas within minimally distended  ascending and transverse and proximal descending colon There is a small amount of extraluminal gas in the left lateral abdomen and a small amount overlying the liver. There is some gas within bowel in the true bony pelvis. No abnormal soft tissue calcifications are observed. There is are 2 tubular structures projecting over the pelvis which may reflect abdominal drains. IMPRESSION: No evidence of bowel obstruction or ileus. There is a moderate amount of free extraluminal gas within the abdomen which may be postsurgical. Correlation with any clinical signs that might indicate perforation is needed. Electronically Signed   By: David  JMartiniqueM.D.   On: 10/06/2015 07:22    Medications / Allergies:  Scheduled Meds: . bethanechol  25 mg Oral TID  .  enoxaparin (LOVENOX) injection  40 mg Subcutaneous Q24H  . ertapenem  1 g Intravenous Q24H  . famotidine (PEPCID) IV  20 mg Intravenous Q24H  . tamsulosin  0.4 mg Oral QPC breakfast   Continuous Infusions: . dextrose 5 % and 0.9% NaCl 75 mL/hr at 10/06/15 2117   PRN Meds:.acetaminophen, albuterol, diatrizoate meglumine-sodium, methocarbamol (ROBAXIN)  IV, morphine injection, ondansetron **OR** ondansetron (ZOFRAN) IV, prochlorperazine  Antibiotics: Anti-infectives    Start     Dose/Rate Route Frequency Ordered Stop   10/02/15 1400  ertapenem (INVANZ) 1 g in sodium chloride 0.9 % 50 mL IVPB     1 g 100 mL/hr over 30 Minutes Intravenous Every 24 hours 10/02/15 1151     09/29/15 2230  [MAR Hold]  piperacillin-tazobactam (ZOSYN) IVPB 3.375 g  Status:  Discontinued     (MAR Hold since 10/02/15 0957)   3.375 g 12.5 mL/hr over 240 Minutes Intravenous Every 8 hours 09/29/15 2217 10/02/15 1151   09/29/15 2015  levofloxacin (LEVAQUIN) IVPB 750 mg     750 mg 100 mL/hr over 90 Minutes Intravenous  Once 09/29/15 2002 09/29/15 2148   09/29/15 2015  metroNIDAZOLE (FLAGYL) IVPB 500 mg     500 mg 100 mL/hr over 60 Minutes Intravenous  Once 09/29/15 2002 09/29/15  2203     Assessment/Plan HD #9 Complicated sigmoid diverticulitis with intra-abdominal abscess POD #5 s/p Laparoscopic drainage of abscess, intraperitoneal lavage, placement of drains Leukocytosis - up to 20.2 Post-operative ileus T 102, no increased tenderness, taking in morphine very regularly(1m).  Will repeat CBC.  CT results noted, continued fluid collection, do not appear to be drained by surgical drains.  -Ambulate and IS -Drain sponge dressing changes daily Hypokalemia - repeat labs.  Urinary retention -voiding trial today DVT proph - lovenox ID-was on Invanz, now on zosyn FEN - sips/chips while nauseated with pain meds. Has not eaten in 9 days.  Will check prealbumin, he may need TPN given duration of NPO status unless attending feels like we can start clears +breeze, but doubtful given pain/fevers/leukocytosis.  Disp - continue inpatient   EErby Pian AAd[facility]illsSur[facility] Pager [phone](7A-4:30P)   10/07/2015 9:03 AM

## 2015-10-08 LAB — GLUCOSE, CAPILLARY
GLUCOSE-CAPILLARY: 132 mg/dL — AB (ref 65–99)
Glucose-Capillary: 153 mg/dL — ABNORMAL HIGH (ref 65–99)

## 2015-10-08 LAB — BASIC METABOLIC PANEL
ANION GAP: 7 (ref 5–15)
BUN: 8 mg/dL (ref 6–20)
CO2: 28 mmol/L (ref 22–32)
Calcium: 7.5 mg/dL — ABNORMAL LOW (ref 8.9–10.3)
Chloride: 102 mmol/L (ref 101–111)
Creatinine, Ser: 0.91 mg/dL (ref 0.61–1.24)
GFR calc Af Amer: >60 mL/min (ref >60)
GLUCOSE: 152 mg/dL — AB (ref 65–99)
POTASSIUM: 3.7 mmol/L (ref 3.5–5.1)
Sodium: 137 mmol/L (ref 135–145)

## 2015-10-08 LAB — CBC
HEMATOCRIT: 35.9 % — AB (ref 39.0–52.0)
Hemoglobin: 11.8 g/dL — ABNORMAL LOW (ref 13.0–17.0)
MCH: 29.5 pg (ref 26.0–34.0)
MCHC: 32.9 g/dL (ref 30.0–36.0)
MCV: 89.8 fL (ref 78.0–100.0)
PLATELETS: 393 10*3/uL (ref 150–400)
RBC: 4 MIL/uL — AB (ref 4.22–5.81)
RDW: 14.1 % (ref 11.5–15.5)
WBC: 21 10*3/uL — AB (ref 4.0–10.5)

## 2015-10-08 MED ORDER — SODIUM CHLORIDE 0.9% FLUSH
3.0000 mL | Freq: Two times a day (BID) | INTRAVENOUS | Status: DC
  Administered 2015-10-08 – 2015-10-16 (×12): 3 mL via INTRAVENOUS

## 2015-10-08 MED ORDER — HYDROCODONE-ACETAMINOPHEN 5-325 MG PO TABS
1.0000 | ORAL_TABLET | ORAL | Status: DC | PRN
  Administered 2015-10-08 (×3): 2 via ORAL
  Filled 2015-10-08 (×4): qty 2

## 2015-10-08 MED ORDER — SODIUM CHLORIDE 0.9% FLUSH
3.0000 mL | INTRAVENOUS | Status: DC | PRN
  Administered 2015-10-11: 3 mL via INTRAVENOUS
  Filled 2015-10-08: qty 3

## 2015-10-08 MED ORDER — OXYCODONE-ACETAMINOPHEN 5-325 MG PO TABS: 1.0000 | ORAL_TABLET | ORAL | Status: DC | PRN

## 2015-10-08 MED ORDER — BOOST PLUS PO LIQD
237.0000 mL | Freq: Three times a day (TID) | ORAL | Status: DC
  Administered 2015-10-08 – 2015-10-10 (×4): 237 mL via ORAL
  Filled 2015-10-08 (×10): qty 237

## 2015-10-08 MED ORDER — BOOST / RESOURCE BREEZE PO LIQD
1.0000 | Freq: Three times a day (TID) | ORAL | Status: DC
  Administered 2015-10-08 – 2015-10-10 (×5): 1 via ORAL

## 2015-10-08 MED ORDER — MENTHOL 3 MG MT LOZG
1.0000 | LOZENGE | OROMUCOSAL | Status: DC | PRN
  Administered 2015-10-08 – 2015-10-12 (×2): 3 mg via ORAL
  Filled 2015-10-08: qty 9

## 2015-10-08 NOTE — Progress Notes (Signed)
Patient ID: Eddie Foley, male   DOB: 1955/05/28, 60 y.o.   MRN: [id number]     [address] [place], [place] [address]    Phone: [phone] FAX: [phone]     Subjective: Large bm this AM.  Pain is better, less IV pain meds used overnight. Temp curve is down. Wbc unchanged.   Objective:  Vital signs:  Filed Vitals:   10/07/15 2030 10/07/15 2108 10/08/15 0128 10/08/15 0621  BP:  136/72  126/65  Pulse:  109  105  Temp:  99.8 F (37.7 C)  99 F (37.2 C)  TempSrc:  Oral  Oral  Resp:  20  19  Height:      Weight:      SpO2: 96% 96% 93% 95%    Last BM Date: 10/06/15  Intake/Output   Yesterday:  06/07 0701 - 06/08 0700 In: 2062.5 [P.O.:300; I.V.:1762.5] Out: 1380 [Urine:1325; Drains:55] This shift: I/O last 3 completed shifts: In: 4215 [P.O.:540; I.V.:3675] Out: 2965 [Urine:2875; Drains:90]     Physical Exam: General: Pt awake/alert/oriented x4 in no acute distress Chest: inspiratory wheezes BUL. Marland Kitchen No chest wall pain w good excursion CV: Pulses intact. Regular rhythm MS: Normal AROM mjr joints. No obvious deformity Abdomen: Soft. distended. Incisions are c/d/i. Drains with serous output. No evidence of peritonitis. No incarcerated hernias. Ext: SCDs BLE. No mjr edema. No cyanosis Skin: No petechiae / purpura    Problem List:   Active Problems:   Diverticulitis of colon with perforation   Diverticulitis of large intestine with perforation and abscess    Results:   Labs: Results for orders placed or performed during the hospital encounter of 09/29/15 (from the past 48 hour(s))  Glucose, capillary     Status: Abnormal   Collection Time: 10/06/15  9:49 PM  Result Value Ref Range   Glucose-Capillary 123 (H) 65 - 99 mg/dL  CBC with Differential/Platelet     Status: Abnormal   Collection Time: 10/07/15  8:41 AM  Result Value Ref Range   WBC 21.1 (H) 4.0 - 10.5 K/uL   RBC  4.11 (L) 4.22 - 5.81 MIL/uL   Hemoglobin 12.2 (L) 13.0 - 17.0 g/dL   HCT 36.4 (L) 39.0 - 52.0 %   MCV 88.6 78.0 - 100.0 fL   MCH 29.7 26.0 - 34.0 pg   MCHC 33.5 30.0 - 36.0 g/dL   RDW 14.1 11.5 - 15.5 %   Platelets 376 150 - 400 K/uL   Neutrophils Relative % 79 %   Lymphocytes Relative 9 %   Monocytes Relative 8 %   Eosinophils Relative 4 %   Basophils Relative 0 %   Neutro Abs 16.7 (H) 1.7 - 7.7 K/uL   Lymphs Abs 1.9 0.7 - 4.0 K/uL   Monocytes Absolute 1.7 (H) 0.1 - 1.0 K/uL   Eosinophils Absolute 0.8 (H) 0.0 - 0.7 K/uL   Basophils Absolute 0.0 0.0 - 0.1 K/uL   RBC Morphology POLYCHROMASIA PRESENT    WBC Morphology MILD LEFT SHIFT (1-5% METAS, OCC MYELO, OCC BANDS)   Comprehensive metabolic panel     Status: Abnormal   Collection Time: 10/07/15  8:41 AM  Result Value Ref Range   Sodium 137 135 - 145 mmol/L   Potassium 3.2 (L) 3.5 - 5.1 mmol/L   Chloride 104 101 - 111 mmol/L   CO2 25 22 - 32 mmol/L   Glucose, Bld 153 (H) 65 -  99 mg/dL   BUN 8 6 - 20 mg/dL   Creatinine, Ser 0.79 0.61 - 1.24 mg/dL   Calcium 7.5 (L) 8.9 - 10.3 mg/dL   Total Protein 5.4 (L) 6.5 - 8.1 g/dL   Albumin 2.0 (L) 3.5 - 5.0 g/dL   AST 25 15 - 41 U/L   ALT 19 17 - 63 U/L   Alkaline Phosphatase 43 38 - 126 U/L   Total Bilirubin 0.9 0.3 - 1.2 mg/dL   GFR calc non Af Amer >60 >60 mL/min   GFR calc Af Amer >60 >60 mL/min    Comment: (NOTE) The eGFR has been calculated using the CKD EPI equation. This calculation has not been validated in all clinical situations. eGFR's persistently <60 mL/min signify possible Chronic Kidney Disease.    Anion gap 8 5 - 15  Protime-INR     Status: None   Collection Time: 10/07/15  8:41 AM  Result Value Ref Range   Prothrombin Time 14.4 11.6 - 15.2 seconds   INR 1.11 0.00 - 1.49  Prealbumin     Status: Abnormal   Collection Time: 10/07/15  9:41 AM  Result Value Ref Range   Prealbumin 4.6 (L) 18 - 38 mg/dL    Comment: Performed at [facility]  Glucose,  capillary     Status: Abnormal   Collection Time: 10/07/15  9:56 PM  Result Value Ref Range   Glucose-Capillary 148 (H) 65 - 99 mg/dL  CBC     Status: Abnormal   Collection Time: 10/08/15  4:49 AM  Result Value Ref Range   WBC 21.0 (H) 4.0 - 10.5 K/uL   RBC 4.00 (L) 4.22 - 5.81 MIL/uL   Hemoglobin 11.8 (L) 13.0 - 17.0 g/dL   HCT 35.9 (L) 39.0 - 52.0 %   MCV 89.8 78.0 - 100.0 fL   MCH 29.5 26.0 - 34.0 pg   MCHC 32.9 30.0 - 36.0 g/dL   RDW 14.1 11.5 - 15.5 %   Platelets 393 150 - 400 K/uL  Basic metabolic panel     Status: Abnormal   Collection Time: 10/08/15  4:49 AM  Result Value Ref Range   Sodium 137 135 - 145 mmol/L   Potassium 3.7 3.5 - 5.1 mmol/L   Chloride 102 101 - 111 mmol/L   CO2 28 22 - 32 mmol/L   Glucose, Bld 152 (H) 65 - 99 mg/dL   BUN 8 6 - 20 mg/dL   Creatinine, Ser 0.91 0.61 - 1.24 mg/dL   Calcium 7.5 (L) 8.9 - 10.3 mg/dL   GFR calc non Af Amer >60 >60 mL/min   GFR calc Af Amer >60 >60 mL/min    Comment: (NOTE) The eGFR has been calculated using the CKD EPI equation. This calculation has not been validated in all clinical situations. eGFR's persistently <60 mL/min signify possible Chronic Kidney Disease.    Anion gap 7 5 - 15    Imaging / Studies: Ct Abdomen Pelvis W Contrast  10/06/2015  CLINICAL DATA:  History of sigmoid diverticulitis with intra-abdominal abscess and recent laparoscopic abdominal drainage and drain placement EXAM: CT ABDOMEN AND PELVIS WITH CONTRAST TECHNIQUE: Multidetector CT imaging of the abdomen and pelvis was performed using the standard protocol following bolus administration of intravenous contrast. CONTRAST:  16m ISOVUE-300 IOPAMIDOL (ISOVUE-300) INJECTION 61% COMPARISON:  09/29/2015 FINDINGS: Lung bases demonstrate mild right pleural effusion and right basilar atelectatic changes. The liver is diffusely fatty infiltrated. The gallbladder has been surgically removed. The spleen, adrenal  glands and pancreas are within normal limits.  The kidneys show no obstructive changes are stable from the prior exam. Stable right lower pole renal calculus is again seen. The bladder shows evidence of a Foley catheter with near complete decompression. Postsurgical changes are noted with 2 surgical drains in place. I considerable increase in the amount of intraperitoneal air and extraperitoneal air is noted likely related to the laparoscopic procedure. The known abscess is again identified and measures approximately 5.0 by 4.0 cm. This is an increase from the previous exam at which time it measured 3.5 x 2.6 cm. Surgical drains do not reach this collection in a satisfactory fashion. No new definitive abscess is seen. The distal sigmoid colon is within normal limits. No obstructive changes are noted. The osseous structures show no acute abnormality. No pelvic mass lesion or significant free pelvic fluid is noted. IMPRESSION: Considerable increase in the degree of both intra and extraperitoneal air likely related to the recent laparoscopic procedure. The previously seen abscess arising from the the sigmoid colon is again identified and has increased slightly in the interval from the prior exam. The surgical drains appear to be somewhat inferior and anterior to this collection and not adequately reaching the collection. This again remains not amenable to percutaneous drainage. Small right pleural effusion and right basilar atelectasis. These results will be called to the ordering clinician or representative by the Radiologist Assistant, and communication documented in the PACS or zVision Dashboard. Electronically Signed   By: Inez Catalina M.D.   On: 10/06/2015 17:46    Medications / Allergies:  Scheduled Meds: . acetaminophen  650 mg Oral Q6H  . bethanechol  25 mg Oral TID  . enoxaparin (LOVENOX) injection  40 mg Subcutaneous Q24H  . ertapenem  1 g Intravenous Q24H  . famotidine (PEPCID) IV  20 mg Intravenous Q24H  . feeding supplement  1 Container Oral  TID BM  . fluconazole (DIFLUCAN) IV  200 mg Intravenous Q24H  . methocarbamol (ROBAXIN)  IV  1,000 mg Intravenous Q8H  . sodium chloride flush  3 mL Intravenous Q12H  . tamsulosin  0.4 mg Oral QPC breakfast   Continuous Infusions:  PRN Meds:.albuterol, diatrizoate meglumine-sodium, menthol-cetylpyridinium, morphine injection, ondansetron **OR** ondansetron (ZOFRAN) IV, oxyCODONE-acetaminophen, prochlorperazine, sodium chloride flush  Antibiotics: Anti-infectives    Start     Dose/Rate Route Frequency Ordered Stop   10/07/15 1300  fluconazole (DIFLUCAN) IVPB 200 mg     200 mg 100 mL/hr over 60 Minutes Intravenous Every 24 hours 10/07/15 1152     10/02/15 1400  ertapenem (INVANZ) 1 g in sodium chloride 0.9 % 50 mL IVPB     1 g 100 mL/hr over 30 Minutes Intravenous Every 24 hours 10/02/15 1151     09/29/15 2230  [MAR Hold]  piperacillin-tazobactam (ZOSYN) IVPB 3.375 g  Status:  Discontinued     (MAR Hold since 10/02/15 0957)   3.375 g 12.5 mL/hr over 240 Minutes Intravenous Every 8 hours 09/29/15 2217 10/02/15 1151   09/29/15 2015  levofloxacin (LEVAQUIN) IVPB 750 mg     750 mg 100 mL/hr over 90 Minutes Intravenous  Once 09/29/15 2002 09/29/15 2148   09/29/15 2015  metroNIDAZOLE (FLAGYL) IVPB 500 mg     500 mg 100 mL/hr over 60 Minutes Intravenous  Once 09/29/15 2002 09/29/15 2203        Assessment/Plan HD #98 Complicated sigmoid diverticulitis with intra-abdominal abscess POD #6 s/p Laparoscopic drainage of abscess, intraperitoneal lavage, placement of drains Leukocytosis -  up to 20.2 Post-operative ileus Temp curve down, wbc unchanged, pain has improved -per IR, CT shows gas and perhaps a phlegmon rather than an abscess.  Continue non op management.  -Drain sponge dressing changes daily Hypokalemia - resolved  Urinary retention -resolved.  Will need to wean off urecholine/flomax. DVT proph - lovenox ID-was on Invanz, fluconazole added 6/7 FEN - advance to fulls,  sliv resp-suspect he has underlying lung disease, PRN nebs.  Will need OP PFTs.  PCM-prealbumin 4.6.  Add breeze, advance diet.  Disp - continue inpatient  Erby Pian, [facility] Pager [phone](7A-4:30P)   10/08/2015 8:55 AM

## 2015-10-08 NOTE — Progress Notes (Signed)
Nutrition Brief Note  Patient identified for Length of Stay approaching 10 days.  Wt Readings from Last 15 Encounters:  09/29/15 261 lb (118.389 kg)  09/29/15 261 lb 6.4 oz (118.57 kg)  09/09/15 265 lb 7 oz (120.402 kg)  06/26/15 268 lb (121.564 kg)  06/12/15 271 lb (122.925 kg)  06/08/15 268 lb 3 oz (121.649 kg)  05/08/15 272 lb (123.378 kg)  04/24/15 268 lb (121.564 kg)  03/12/15 275 lb (124.739 kg)  03/12/15 273 lb (123.832 kg)  03/12/15 270 lb (122.471 kg)  02/20/15 267 lb 6.4 oz (121.292 kg)  12/12/14 267 lb 6 oz (121.281 kg)  09/24/14 263 lb (119.296 kg)  08/08/14 264 lb (119.75 kg)   Wt displays fluctuations but is 3# below baseline from 08/2014 He is 6 days s/p Laparoscopic drainage of abscess, intraperitoneal lavage, and placements of drains for sigmoid diverticulitis with intra-abdominal abscess. Pt was on clear liquids yesterday has been advanced to full liquids to day tolerating well. He also had a large BM today.  Pt was receiving boost breeze per surgery but did not like, thus I ordered Boost plus TID with meals, each supplement provides 350 calories and 13g protein.  Pt endorses good appetite PTA, no wt loss.  Body mass index is 39.69 kg/(m^2). Patient meets criteria for obese class II based on current BMI.   Current diet order is full liquids, patient is consuming approximately 50% of meals at this time. Labs and medications reviewed.   No nutrition interventions warranted at this time. If nutrition issues arise, please consult RD.   Eddie Foley. Eddie Sago, MS, RD LDN Inpatient Clinical Dietitian Pager [phone]

## 2015-10-09 ENCOUNTER — Ambulatory Visit: Payer: BLUE CROSS/BLUE SHIELD | Admitting: Family Medicine

## 2015-10-09 LAB — BASIC METABOLIC PANEL
ANION GAP: 6 (ref 5–15)
BUN: 8 mg/dL (ref 6–20)
CALCIUM: 7.7 mg/dL — AB (ref 8.9–10.3)
CO2: 30 mmol/L (ref 22–32)
Chloride: 102 mmol/L (ref 101–111)
Creatinine, Ser: 0.85 mg/dL (ref 0.61–1.24)
GFR calc Af Amer: >60 mL/min (ref >60)
GLUCOSE: 129 mg/dL — AB (ref 65–99)
Potassium: 3.7 mmol/L (ref 3.5–5.1)
SODIUM: 138 mmol/L (ref 135–145)

## 2015-10-09 LAB — CBC
HCT: 37.3 % — ABNORMAL LOW (ref 39.0–52.0)
Hemoglobin: 12.2 g/dL — ABNORMAL LOW (ref 13.0–17.0)
MCH: 29.7 pg (ref 26.0–34.0)
MCHC: 32.7 g/dL (ref 30.0–36.0)
MCV: 90.8 fL (ref 78.0–100.0)
PLATELETS: 469 10*3/uL — AB (ref 150–400)
RBC: 4.11 MIL/uL — ABNORMAL LOW (ref 4.22–5.81)
RDW: 14.2 % (ref 11.5–15.5)
WBC: 19.9 10*3/uL — AB (ref 4.0–10.5)

## 2015-10-09 LAB — GLUCOSE, CAPILLARY: Glucose-Capillary: 141 mg/dL — ABNORMAL HIGH (ref 65–99)

## 2015-10-09 MED ORDER — OXYCODONE HCL 5 MG PO TABS
5.0000 mg | ORAL_TABLET | ORAL | Status: DC | PRN
  Administered 2015-10-09 – 2015-10-14 (×28): 10 mg via ORAL
  Filled 2015-10-09 (×28): qty 2

## 2015-10-09 MED ORDER — ALUM & MAG HYDROXIDE-SIMETH 200-200-20 MG/5ML PO SUSP
30.0000 mL | ORAL | Status: DC | PRN
  Administered 2015-10-09: 30 mL via ORAL
  Filled 2015-10-09: qty 30

## 2015-10-09 MED ORDER — BETHANECHOL CHLORIDE 10 MG PO TABS
10.0000 mg | ORAL_TABLET | Freq: Three times a day (TID) | ORAL | Status: DC
  Administered 2015-10-09 – 2015-10-16 (×22): 10 mg via ORAL
  Filled 2015-10-09 (×24): qty 1

## 2015-10-09 MED ORDER — ENOXAPARIN SODIUM 60 MG/0.6ML ~~LOC~~ SOLN
60.0000 mg | SUBCUTANEOUS | Status: DC
Start: 2015-10-09 — End: 2015-10-16
  Administered 2015-10-09 – 2015-10-16 (×8): 60 mg via SUBCUTANEOUS
  Filled 2015-10-09 (×8): qty 0.6

## 2015-10-09 MED ORDER — MORPHINE SULFATE (PF) 2 MG/ML IV SOLN
2.0000 mg | INTRAVENOUS | Status: DC | PRN
  Administered 2015-10-10: 2 mg via INTRAVENOUS
  Filled 2015-10-09: qty 1

## 2015-10-09 NOTE — Plan of Care (Signed)
Problem: Food- and Nutrition-Related Knowledge Deficit (NB-1.1) Goal: Nutrition education Formal process to instruct or train a patient/client in a skill or to impart knowledge to help patients/clients voluntarily manage or modify food choices and eating behavior to maintain or improve health. Outcome: Completed/Met Date Met:  10/09/15 Nutrition Education Note  RD consulted for nutrition education regarding a low fiber diet for diverticulitis.  RD provided "Fiber Restricted Nutrition Therapy" handout from the [employer]. Reviewed patient's dietary recall. Provided examples of low and high fiber foods. Discouraged intake of high fiber foods, processed foods, caffeine and red meats. Encouraged use of a multi-vitamin while following a low fiber diet. Teach back method used.  Expect fair compliance.  Body mass index is 39.69 kg/(m^2). Qualifies for obese class II based on current BMI.  Current diet order is SOFT, patient is consuming approximately 50% of meals at this time. Labs and medications reviewed. No further nutrition interventions warranted at this time. RD contact information provided. If additional nutrition issues arise, please re-consult RD.  Satira Anis. Kaina Orengo, MS, RD LDN Inpatient Clinical Dietitian Pager [phone]

## 2015-10-09 NOTE — Progress Notes (Signed)
[facility] Progress Note  7 Days Post-Op  Subjective: Pt doing well, no N/V.  Pain improving and distension.  He had a awful bout of uncontrolled diarrhea from the bed to the bathroom this morning. He just got cleaned up and now he's worn out.  Ambulating some OOB.  Not much in halls yet.  Wife and son at bedside.    Objective: Vital signs in last 24 hours: Temp:  [98.9 F (37.2 C)-100.5 F (38.1 C)] 98.9 F (37.2 C) (06/09 0516) Pulse Rate:  [90-101] 101 (06/09 0516) Resp:  [18-19] 18 (06/09 0516) BP: (130-158)/(62-80) 158/80 mmHg (06/09 0516) SpO2:  [96 %-97 %] 96 % (06/09 0516) Last BM Date: 10/08/15  Intake/Output from previous day: 06/08 0701 - 06/09 0700 In: -  Out: 1050 [Urine:1050] Intake/Output this shift:    PE: Gen:  Alert, NAD, pleasant Card:  RRR, no M/G/R heard Pulm:  CTA, no W/R/R, good effort Abd: Soft, much less distended, less tender, +BS, no HSM, incisions C/D/I, drain with minimal serous drainage, RLQ drain slightly cloudy   Lab Results:   Recent Labs  10/08/15 0449 10/09/15 0505  WBC 21.0* 19.9*  HGB 11.8* 12.2*  HCT 35.9* 37.3*  PLT 393 469*   BMET  Recent Labs  10/08/15 0449 10/09/15 0505  NA 137 138  K 3.7 3.7  CL 102 102  CO2 28 30  GLUCOSE 152* 129*  BUN 8 8  CREATININE 0.91 0.85  CALCIUM 7.5* 7.7*   PT/INR  Recent Labs  10/07/15 0841  LABPROT 14.4  INR 1.11   CMP     Component Value Date/Time   NA 138 10/09/2015 0505   NA 145* 02/21/2015 0907   K 3.7 10/09/2015 0505   CL 102 10/09/2015 0505   CO2 30 10/09/2015 0505   GLUCOSE 129* 10/09/2015 0505   GLUCOSE 106* 02/21/2015 0907   BUN 8 10/09/2015 0505   BUN 12 02/21/2015 0907   CREATININE 0.85 10/09/2015 0505   CALCIUM 7.7* 10/09/2015 0505   PROT 5.4* 10/07/2015 0841   PROT 6.2 02/21/2015 0907   ALBUMIN 2.0* 10/07/2015 0841   ALBUMIN 4.1 02/21/2015 0907   AST 25 10/07/2015 0841   ALT 19 10/07/2015 0841   ALKPHOS 43 10/07/2015 0841   BILITOT  0.9 10/07/2015 0841   BILITOT 0.2 02/21/2015 0907   GFRNONAA >60 10/09/2015 0505   GFRAA >60 10/09/2015 0505   Lipase     Component Value Date/Time   LIPASE 16 09/29/2015 1845       Studies/Results: No results found.  Anti-infectives: Anti-infectives    Start     Dose/Rate Route Frequency Ordered Stop   10/07/15 1300  fluconazole (DIFLUCAN) IVPB 200 mg     200 mg 100 mL/hr over 60 Minutes Intravenous Every 24 hours 10/07/15 1152     10/02/15 1400  ertapenem (INVANZ) 1 g in sodium chloride 0.9 % 50 mL IVPB     1 g 100 mL/hr over 30 Minutes Intravenous Every 24 hours 10/02/15 1151     09/29/15 2230  [MAR Hold]  piperacillin-tazobactam (ZOSYN) IVPB 3.375 g  Status:  Discontinued     (MAR Hold since 10/02/15 0957)   3.375 g 12.5 mL/hr over 240 Minutes Intravenous Every 8 hours 09/29/15 2217 10/02/15 1151   09/29/15 2015  levofloxacin (LEVAQUIN) IVPB 750 mg     750 mg 100 mL/hr over 90 Minutes Intravenous  Once 09/29/15 2002 09/29/15 2148   09/29/15 2015  metroNIDAZOLE (FLAGYL) IVPB 500  mg     500 mg 100 mL/hr over 60 Minutes Intravenous  Once 09/29/15 2002 09/29/15 2203       Assessment/Plan HD 0000000 Complicated sigmoid diverticulitis with intra-abdominal abscess POD #7 s/p Laparoscopic drainage of abscess, intraperitoneal lavage, placement of drains Leukocytosis - down to 19.9 Post-operative ileus -Temp curve down, WBC and pain improving -Per IR, CT shows gas and perhaps a phlegmon rather than an abscess. Continue non op management.  -Drain sponge dressing changes daily -Drains with low output, repeat CT scan next week or before discharge -Dietitian consult for diet education Hypokalemia - resolved  Urinary retention -resolved. D/c flomax today, continue to wean urecholine over the weekend DVT proph - lovenox ID-on Invanz, fluconazole added 10/07/15 (I think the fluconazole is helping plan to continue for at least 7 days) FEN - advance to soft diet,  SLIV Resp-suspect he has underlying lung disease, PRN nebs. Will need OP PFTs.  PCM-prealbumin 4.6. Add breeze, advance diet.  Deconditioning-PT ordered Disp - continue inpatient, d/c when WBC, mobility and diet improved, will need follow up with Dr. Zella Richer at discharge in 2-3 weeks    LOS: 10 days    Nat Christen 10/09/2015, 9:18 AM Pager: [phone]  (7am - 4:30pm M-F; 7am - 11:30am Sa/Su)

## 2015-10-09 NOTE — Evaluation (Signed)
Physical Therapy Evaluation Patient Details Name: BARRI NORELL MRN: [id number] DOB: 07-02-1955 Today's Date: 10/09/2015   History of Present Illness  60 yo male s/p lap drainage of abscess, intraperitoneal lavage, placement of drains 10/02/15. Hx of neruopathy, L shoulder surgery 07/2015  Clinical Impression  On eval, pt required Min assist for bed mobility and Min guard assist for ambulation. Pt walked ~125 feet. Pain rated 8/10 with activity. Discussed d/c plan-pt will return home. Do not anticipate any follow up PT needs at this time. Pt will need a RW and possibly a hospital bed. Wife feel bed may be necessary. Pt had L shoulder surgery recently and still has some limitations. Will continue to work on bed mobility during hospital stay.     Follow Up Recommendations No PT follow up;Supervision/Assistance - 24 hour    Equipment Recommendations  Rolling walker with 5" wheels; Hospital bed (possibly/depends on progress)   Recommendations for Other Services       Precautions / Restrictions Precautions Precaution Comments: L and R jp drains; abd surgery Restrictions Weight Bearing Restrictions: No      Mobility  Bed Mobility Overal bed mobility: Needs Assistance Bed Mobility: Sit to Supine       Sit to supine: HOB elevated   General bed mobility comments: Wife assisted with bil LEs. Increased time. Discussed need to practice logroll, side<>sit technique soon  Transfers Overall transfer level: Needs assistance Equipment used: Rolling walker (2 wheeled) Transfers: Sit to/from Stand Sit to Stand: Min guard         General transfer comment: Multiple attempts. Effortful. Close guard for safety. VCs hand placement.   Ambulation/Gait Ambulation/Gait assistance: Min guard Ambulation Distance (Feet): 115 Feet Assistive device: Rolling walker (2 wheeled) Gait Pattern/deviations: Step-through pattern;Decreased stride length     General Gait Details: close guard for safety.  Pt fatigues fairly easily. No LOB.  Stairs            Wheelchair Mobility    Modified Rankin (Stroke Patients Only)       Balance                                             Pertinent Vitals/Pain Pain Assessment: 0-10 Pain Score: 8  Pain Location: abd with activity Pain Descriptors / Indicators: Sore Pain Intervention(s): Monitored during session    Home Living Family/patient expects to be discharged to:: Private residence Living Arrangements: Spouse/significant other   Type of Home: House Home Access: Stairs to enter Entrance Stairs-Rails: Right Entrance Stairs-Number of Steps: 3 Home Layout: One level Home Equipment: None      Prior Function Level of Independence: Independent               Hand Dominance        Extremity/Trunk Assessment   Upper Extremity Assessment: LUE deficits/detail       LUE Deficits / Details: recent L shoulder surgery 07/2015-still with some limitations/deficits   Lower Extremity Assessment: Generalized weakness      Cervical / Trunk Assessment: Normal  Communication   Communication: No difficulties  Cognition Arousal/Alertness: Awake/alert Behavior During Therapy: WFL for tasks assessed/performed Overall Cognitive Status: Within Functional Limits for tasks assessed                      General Comments      Exercises  Assessment/Plan    PT Assessment Patient needs continued PT services  PT Diagnosis Difficulty walking;Acute pain;Generalized weakness   PT Problem List Decreased strength;Decreased activity tolerance;Decreased balance;Obesity;Decreased mobility;Pain;Decreased knowledge of use of DME  PT Treatment Interventions DME instruction;Gait training;Functional mobility training;Therapeutic activities;Patient/family education;Balance training;Therapeutic exercise   PT Goals (Current goals can be found in the Care Plan section) Acute Rehab PT Goals Patient Stated Goal:  less pain. home soon.  PT Goal Formulation: With patient/family Time For Goal Achievement: 10/23/15 Potential to Achieve Goals: Good    Frequency Min 3X/week   Barriers to discharge        Co-evaluation               End of Session   Activity Tolerance: Patient tolerated treatment well Patient left: in bed;with call bell/phone within reach;with family/visitor present           Time: [id number] PT Time Calculation (min) (ACUTE ONLY): 16 min   Charges:   PT Evaluation $PT Eval Low Complexity: 1 Procedure     PT G Codes:        Weston Anna, MPT Pager: [phone]

## 2015-10-09 NOTE — Progress Notes (Signed)
Date:  October 09, 2015 Chart reviewed for concurrent status and case management needs. Will continue to follow the patient for changes and needs:  Post op ileus Expected discharge date: [id number] Velva Harman, BSN, [place], [place]

## 2015-10-10 LAB — GLUCOSE, CAPILLARY: GLUCOSE-CAPILLARY: 145 mg/dL — AB (ref 65–99)

## 2015-10-10 NOTE — Progress Notes (Signed)
8 Days Post-Op  Subjective: Feels a little more pain on right side than yesterday. Tolerating diet  Objective: Vital signs in last 24 hours: Temp:  [98.9 F (37.2 C)-99.3 F (37.4 C)] 99.3 F (37.4 C) (06/10 0615) Pulse Rate:  [102-106] 102 (06/10 0615) Resp:  [18-24] 24 (06/10 0615) BP: (130-141)/(70-75) 130/72 mmHg (06/10 0615) SpO2:  [96 %] 96 % (06/10 0615) Last BM Date: 10/08/15  Intake/Output from previous day: 06/09 0701 - 06/10 0700 In: 1840 [P.O.:220; IV Piggyback:1620] Out: 15 [Drains:15] Intake/Output this shift:    Resp: clear to auscultation bilaterally Cardio: regular rate and rhythm GI: soft, mild tenderness in right. drain output serous  Lab Results:   Recent Labs  10/08/15 0449 10/09/15 0505  WBC 21.0* 19.9*  HGB 11.8* 12.2*  HCT 35.9* 37.3*  PLT 393 469*   BMET  Recent Labs  10/08/15 0449 10/09/15 0505  NA 137 138  K 3.7 3.7  CL 102 102  CO2 28 30  GLUCOSE 152* 129*  BUN 8 8  CREATININE 0.91 0.85  CALCIUM 7.5* 7.7*   PT/INR  Recent Labs  10/07/15 0841  LABPROT 14.4  INR 1.11   ABG No results for input(s): PHART, HCO3 in the last 72 hours.  Invalid input(s): PCO2, PO2  Studies/Results: No results found.  Anti-infectives: Anti-infectives    Start     Dose/Rate Route Frequency Ordered Stop   10/07/15 1300  fluconazole (DIFLUCAN) IVPB 200 mg     200 mg 100 mL/hr over 60 Minutes Intravenous Every 24 hours 10/07/15 1152     10/02/15 1400  ertapenem (INVANZ) 1 g in sodium chloride 0.9 % 50 mL IVPB     1 g 100 mL/hr over 30 Minutes Intravenous Every 24 hours 10/02/15 1151     09/29/15 2230  [MAR Hold]  piperacillin-tazobactam (ZOSYN) IVPB 3.375 g  Status:  Discontinued     (MAR Hold since 10/02/15 0957)   3.375 g 12.5 mL/hr over 240 Minutes Intravenous Every 8 hours 09/29/15 2217 10/02/15 1151   09/29/15 2015  levofloxacin (LEVAQUIN) IVPB 750 mg     750 mg 100 mL/hr over 90 Minutes Intravenous  Once 09/29/15 2002 09/29/15  2148   09/29/15 2015  metroNIDAZOLE (FLAGYL) IVPB 500 mg     500 mg 100 mL/hr over 60 Minutes Intravenous  Once 09/29/15 2002 09/29/15 2203      Assessment/Plan: s/p Procedure(s): LAPAROSCOPIC DRAINAGE OF ABDOMINAL ABSCESS, LAVAGE, PLACEMENT OF DRAINS (N/A) Continue soft diet  Continue IV invanz and diflucan Wbc still up. Consider repeat CT  LOS: 11 days    TOTH III,PAUL S 10/10/2015

## 2015-10-11 LAB — CBC WITH DIFFERENTIAL/PLATELET
Basophils Absolute: 0 10*3/uL (ref 0.0–0.1)
Basophils Relative: 0 %
EOS ABS: 0.8 10*3/uL — AB (ref 0.0–0.7)
Eosinophils Relative: 4 %
HCT: 37.3 % — ABNORMAL LOW (ref 39.0–52.0)
HEMOGLOBIN: 12.1 g/dL — AB (ref 13.0–17.0)
LYMPHS ABS: 1.9 10*3/uL (ref 0.7–4.0)
Lymphocytes Relative: 10 %
MCH: 29.2 pg (ref 26.0–34.0)
MCHC: 32.4 g/dL (ref 30.0–36.0)
MCV: 90.1 fL (ref 78.0–100.0)
MONO ABS: 1.3 10*3/uL — AB (ref 0.1–1.0)
MONOS PCT: 7 %
NEUTROS PCT: 79 %
Neutro Abs: 14.9 10*3/uL — ABNORMAL HIGH (ref 1.7–7.7)
Platelets: 556 10*3/uL — ABNORMAL HIGH (ref 150–400)
RBC: 4.14 MIL/uL — ABNORMAL LOW (ref 4.22–5.81)
RDW: 14.2 % (ref 11.5–15.5)
WBC: 19 10*3/uL — ABNORMAL HIGH (ref 4.0–10.5)

## 2015-10-11 LAB — GLUCOSE, CAPILLARY: GLUCOSE-CAPILLARY: 147 mg/dL — AB (ref 65–99)

## 2015-10-11 MED ORDER — FAMOTIDINE 20 MG PO TABS
20.0000 mg | ORAL_TABLET | Freq: Every day | ORAL | Status: DC
  Administered 2015-10-11 – 2015-10-16 (×6): 20 mg via ORAL
  Filled 2015-10-11 (×6): qty 1

## 2015-10-11 MED ORDER — BOOST PLUS PO LIQD
237.0000 mL | Freq: Three times a day (TID) | ORAL | Status: DC
  Administered 2015-10-11 – 2015-10-16 (×17): 237 mL via ORAL
  Filled 2015-10-11 (×23): qty 237

## 2015-10-11 NOTE — Progress Notes (Signed)
9 Days Post-Op  Subjective: Feels better. Had a bm this am  Objective: Vital signs in last 24 hours: Temp:  [98.2 F (36.8 C)-99.5 F (37.5 C)] 99.5 F (37.5 C) (06/11 0511) Pulse Rate:  [101-109] 105 (06/11 0511) Resp:  [18-19] 19 (06/11 0511) BP: (118-142)/(66-71) 142/71 mmHg (06/11 0511) SpO2:  [93 %-95 %] 94 % (06/11 0511) Last BM Date: 10/09/15 (per pt)  Intake/Output from previous day: 06/10 0701 - 06/11 0700 In: -  Out: 1105 [Urine:1100; Drains:5] Intake/Output this shift:    Resp: clear to auscultation bilaterally Cardio: regular rate and rhythm GI: soft, minimal tenderness. drain output serous  Lab Results:   Recent Labs  10/09/15 0505  WBC 19.9*  HGB 12.2*  HCT 37.3*  PLT 469*   BMET  Recent Labs  10/09/15 0505  NA 138  K 3.7  CL 102  CO2 30  GLUCOSE 129*  BUN 8  CREATININE 0.85  CALCIUM 7.7*   PT/INR No results for input(s): LABPROT, INR in the last 72 hours. ABG No results for input(s): PHART, HCO3 in the last 72 hours.  Invalid input(s): PCO2, PO2  Studies/Results: No results found.  Anti-infectives: Anti-infectives    Start     Dose/Rate Route Frequency Ordered Stop   10/07/15 1300  fluconazole (DIFLUCAN) IVPB 200 mg     200 mg 100 mL/hr over 60 Minutes Intravenous Every 24 hours 10/07/15 1152     10/02/15 1400  ertapenem (INVANZ) 1 g in sodium chloride 0.9 % 50 mL IVPB     1 g 100 mL/hr over 30 Minutes Intravenous Every 24 hours 10/02/15 1151     09/29/15 2230  [MAR Hold]  piperacillin-tazobactam (ZOSYN) IVPB 3.375 g  Status:  Discontinued     (MAR Hold since 10/02/15 0957)   3.375 g 12.5 mL/hr over 240 Minutes Intravenous Every 8 hours 09/29/15 2217 10/02/15 1151   09/29/15 2015  levofloxacin (LEVAQUIN) IVPB 750 mg     750 mg 100 mL/hr over 90 Minutes Intravenous  Once 09/29/15 2002 09/29/15 2148   09/29/15 2015  metroNIDAZOLE (FLAGYL) IVPB 500 mg     500 mg 100 mL/hr over 60 Minutes Intravenous  Once 09/29/15 2002 09/29/15  2203      Assessment/Plan: s/p Procedure(s): LAPAROSCOPIC DRAINAGE OF ABDOMINAL ABSCESS, LAVAGE, PLACEMENT OF DRAINS (N/A) Advance diet  Continue Invanz and Diflucan Ambulate Check wbc today Probably repeat CT on tuesday  LOS: 12 days    TOTH III,Eddie Foley S 10/11/2015

## 2015-10-11 NOTE — Progress Notes (Signed)
Key Points: Use following P&T approved IV to PO antibiotic change policy.  Description contains the criteria that are approved Note: Policy Excludes:  Esophagectomy patientsPHARMACIST - PHYSICIAN COMMUNICATION DR:  Marlou Starks CONCERNING: IV to Oral Route Change Policy  RECOMMENDATION: This patient is receiving famotidine by the intravenous route.  Based on criteria approved by the Pharmacy and Therapeutics Committee, the intravenous medication(s) is/are being converted to the equivalent oral dose form(s).   DESCRIPTION: These criteria include:  The patient is eating (either orally or via tube) and/or has been taking other orally administered medications for a least 24 hours  The patient has no evidence of active gastrointestinal bleeding or impaired GI absorption (gastrectomy, short bowel, patient on TNA or NPO).  If you have questions about this conversion, please contact the Pharmacy Department  []   [phone] )  [facility] []   [phone] )  [facility] []   [phone] )  [facility] []   [phone] )  [facility] [x]   [phone] )  [place], PharmD, pager [phone]. 10/11/2015,11:48 AM.

## 2015-10-12 ENCOUNTER — Encounter (HOSPITAL_COMMUNITY): Payer: Self-pay | Admitting: Radiology

## 2015-10-12 ENCOUNTER — Inpatient Hospital Stay (HOSPITAL_COMMUNITY): Payer: BLUE CROSS/BLUE SHIELD

## 2015-10-12 LAB — GLUCOSE, CAPILLARY: GLUCOSE-CAPILLARY: 135 mg/dL — AB (ref 65–99)

## 2015-10-12 MED ORDER — IOPAMIDOL (ISOVUE-300) INJECTION 61%
100.0000 mL | Freq: Once | INTRAVENOUS | Status: AC | PRN
  Administered 2015-10-12: 100 mL via INTRAVENOUS

## 2015-10-12 NOTE — Progress Notes (Signed)
Physical Therapy Treatment and Discharge from Acute PT  Patient Details Name: Eddie Foley MRN: [id number] DOB: 1955-05-03 Today's Date: 11/05/15    History of Present Illness 60 yo male s/p lap drainage of abscess, intraperitoneal lavage, placement of drains 10/02/15. Hx of neruopathy, L shoulder surgery 07/2015    PT Comments    Pt ambulating well in hallway with RW.  Spouse reports pt ambulating 4x/day.  Pt overall supervision level and has met acute care goals so will d/c from PT.  Per spouse, Pt plans to start outpatient PT more for neuropathy issues once discharged.   Follow Up Recommendations  Supervision/Assistance - 24 hour;Other (comment) (plans to start PT with outpatient PT at [facility] (prior to admission))     Equipment Recommendations  Rolling walker with 5" wheels    Recommendations for Other Services       Precautions / Restrictions Precautions Precaution Comments: L and R jp drains; abd surgery Restrictions Weight Bearing Restrictions: No    Mobility  Bed Mobility               General bed mobility comments: pt standing in room upon arrival  Transfers Overall transfer level: Needs assistance Equipment used: Rolling walker (2 wheeled) Transfers: Sit to/from Stand Sit to Stand: Supervision         General transfer comment: moving around room with spouse  Ambulation/Gait Ambulation/Gait assistance: Supervision Ambulation Distance (Feet): 140 Feet Assistive device: Rolling walker (2 wheeled) Gait Pattern/deviations: Step-through pattern;Decreased stride length     General Gait Details: slow pace however spouse reports this is due to his neuropathy, steady with RW   Stairs            Wheelchair Mobility    Modified Rankin (Stroke Patients Only)       Balance                                    Cognition Arousal/Alertness: Awake/alert Behavior During Therapy: WFL for tasks assessed/performed Overall  Cognitive Status: Within Functional Limits for tasks assessed                      Exercises      General Comments        Pertinent Vitals/Pain Pain Assessment: 0-10 Pain Score: 3  Pain Location: abdomen Pain Descriptors / Indicators: Sore Pain Intervention(s): Monitored during session    Home Living                      Prior Function            PT Goals (current goals can now be found in the care plan section) Progress towards PT goals: Goals met/education completed, patient discharged from PT    Frequency       PT Plan Other (comment) (d/c from acute PT)    Co-evaluation             End of Session   Activity Tolerance: Patient tolerated treatment well Patient left: with family/visitor present (bathroom with spouse)     Time: 8850-2774 PT Time Calculation (min) (ACUTE ONLY): 8 min  Charges:  $Gait Training: 8-22 mins                    G Codes:      Eddie Foley,KATHrine E 11-05-15, 3:18 PM Carmelia Bake, PT, DPT Nov 05, 2015 Pager: [phone]

## 2015-10-12 NOTE — Progress Notes (Addendum)
10 Days Post-Op  Subjective: He feels better, 30 ml of purulent fluid in his JP on the right this AM.  His wife says that started yesterday, it had been clear before yesterday.    Objective: Vital signs in last 24 hours: Temp:  [98.7 F (37.1 C)-100.8 F (38.2 C)] 100.8 F (38.2 C) (06/12 0457) Pulse Rate:  [103-106] 104 (06/12 0457) Resp:  [19] 19 (06/12 0457) BP: (124-145)/(64-72) 145/72 mmHg (06/12 0457) SpO2:  [93 %-95 %] 95 % (06/12 0457) Last BM Date: 10/11/15 560 PO Drain  5 ml right Drain 4 ml left TM 100.8 last PM Low grade tachycardia WBC 19K Last CT 10/06/15 Intake/Output from previous day: 06/11 0701 - 06/12 0700 In: 560 [P.O.:560] Out: 409 [Urine:400; Drains:9] Intake/Output this shift:    General appearance: alert, cooperative, no distress and he keeps the room very cold. Resp: clear to auscultation bilaterally GI: soft, not really very tender, sites OK, some tape blistering.  Right drain is purulent thick white green fluid.   Lab Results:   Recent Labs  10/11/15 0857  WBC 19.0*  HGB 12.1*  HCT 37.3*  PLT 556*    BMET No results for input(s): NA, K, CL, CO2, GLUCOSE, BUN, CREATININE, CALCIUM in the last 72 hours. PT/INR No results for input(s): LABPROT, INR in the last 72 hours.   Recent Labs Lab 10/07/15 0841  AST 25  ALT 19  ALKPHOS 43  BILITOT 0.9  PROT 5.4*  ALBUMIN 2.0*     Lipase     Component Value Date/Time   LIPASE 16 09/29/2015 1845     Studies/Results: No results found. Prior to Admission medications   Medication Sig Start Date End Date Taking? Authorizing Provider  famotidine (PEPCID) 20 MG tablet Take 20 mg by mouth daily as needed for heartburn or indigestion.    Yes Historical Provider, MD  OVER THE COUNTER MEDICATION Calcium, magnessium, and zinc. Takes one a day   Yes Historical Provider, MD  albuterol (PROVENTIL HFA;VENTOLIN HFA) 108 (90 Base) MCG/ACT inhaler Inhale 2 puffs into the lungs every 4 (four) hours as  needed for wheezing. 06/26/15   Kathyrn Drown, MD  EPINEPHrine 0.3 mg/0.3 mL IJ SOAJ injection Inject 0.3 mLs (0.3 mg total) into the muscle once. 09/24/14   Kathyrn Drown, MD  NEXIUM 40 MG capsule TAKE ONE CAPSULE BY MOUTH ONCE DAILY 06/04/13   Kathyrn Drown, MD    Medications: . acetaminophen  650 mg Oral Q6H  . bethanechol  10 mg Oral TID  . enoxaparin (LOVENOX) injection  60 mg Subcutaneous Q24H  . ertapenem  1 g Intravenous Q24H  . famotidine  20 mg Oral Daily  . fluconazole (DIFLUCAN) IV  200 mg Intravenous Q24H  . lactose free nutrition  237 mL Oral TID WC & HS  . methocarbamol (ROBAXIN)  IV  1,000 mg Intravenous Q8H  . sodium chloride flush  3 mL Intravenous Q12H  Body mass index is 39.69 kg/(m^2).     Assessment/Plan Severe sigmoid diverticulitis with intra-abdominal abscess - admitted 09/29/15 S/p Laparoscopic drainage of abscess, intraperitoneal lavage, placement of drains, 10/02/15 Dr. Zella Richer Ongoing leukocytosis FEN:  Soft diet ID:  Day 10 Invanz, (3 days Zosyn, stopped 10/02/15) DVT:  Lovenox/SCD     Plan:  CT this AM, WBC still up, fever x 1 yesterday in a very cold room, new purulent drainage from the right drain.  LOS: 13 days    [name],[name] 10/12/2015 [phone]  Agree with above.  For CT scan today - concerned about persistent leukocytosis. His wife is in the room with him. He feels better overall with no localized tenderness and is tolerating a diet.  Alphonsa Overall, MD, [facility] Pager: [phone] Office phone:  [phone]

## 2015-10-13 ENCOUNTER — Ambulatory Visit (HOSPITAL_COMMUNITY): Payer: BLUE CROSS/BLUE SHIELD | Admitting: Physical Therapy

## 2015-10-13 LAB — CBC
HEMATOCRIT: 36.9 % — AB (ref 39.0–52.0)
HEMOGLOBIN: 12.3 g/dL — AB (ref 13.0–17.0)
MCH: 29.8 pg (ref 26.0–34.0)
MCHC: 33.3 g/dL (ref 30.0–36.0)
MCV: 89.3 fL (ref 78.0–100.0)
Platelets: 634 10*3/uL — ABNORMAL HIGH (ref 150–400)
RBC: 4.13 MIL/uL — AB (ref 4.22–5.81)
RDW: 14.3 % (ref 11.5–15.5)
WBC: 14 10*3/uL — ABNORMAL HIGH (ref 4.0–10.5)

## 2015-10-13 LAB — COMPREHENSIVE METABOLIC PANEL
ALK PHOS: 56 U/L (ref 38–126)
ALT: 26 U/L (ref 17–63)
ANION GAP: 8 (ref 5–15)
AST: 42 U/L — ABNORMAL HIGH (ref 15–41)
Albumin: 2.3 g/dL — ABNORMAL LOW (ref 3.5–5.0)
BILIRUBIN TOTAL: 0.7 mg/dL (ref 0.3–1.2)
BUN: 10 mg/dL (ref 6–20)
CALCIUM: 8 mg/dL — AB (ref 8.9–10.3)
CO2: 30 mmol/L (ref 22–32)
CREATININE: 0.76 mg/dL (ref 0.61–1.24)
Chloride: 98 mmol/L — ABNORMAL LOW (ref 101–111)
GFR calc Af Amer: >60 mL/min (ref >60)
GFR calc non Af Amer: >60 mL/min (ref >60)
GLUCOSE: 160 mg/dL — AB (ref 65–99)
Potassium: 3.6 mmol/L (ref 3.5–5.1)
SODIUM: 136 mmol/L (ref 135–145)
TOTAL PROTEIN: 6.2 g/dL — AB (ref 6.5–8.1)

## 2015-10-13 LAB — PREALBUMIN: Prealbumin: 8.8 mg/dL — ABNORMAL LOW (ref 18–38)

## 2015-10-13 LAB — GLUCOSE, CAPILLARY: Glucose-Capillary: 129 mg/dL — ABNORMAL HIGH (ref 65–99)

## 2015-10-13 NOTE — Progress Notes (Signed)
11 Days Post-Op  Subjective: Pain is about the same, he is up walking.  The drain on the right is more of a yellow color this AM.  Drain on the left is pretty much empty, but what little that is in there is slightly cloudy this AM.  Objective: Vital signs in last 24 hours: Temp:  [98.2 F (36.8 C)-98.6 F (37 C)] 98.6 F (37 C) (06/13 0922) Pulse Rate:  [91-102] 99 (06/13 0922) Resp:  [16-22] 16 (06/13 0922) BP: (116-145)/(62-73) 126/62 mmHg (06/13 0922) SpO2:  [94 %-97 %] 94 % (06/13 0922) Last BM Date: 10/11/15 410 PO Voided x 2 Drain 30 ml recorded plus the 30 ml I drained yesterday TM 100.8 yesterday, Labs pending CT 10/12/15:  There is significant intraperitoneal air as well as abdominal wall air, overall similar in amount to the prior CT, with a slight decrease in the intraperitoneal air, but a mild increase in the abdominal wall air. The relative persistence of this air since the laparoscopic procedure suggests a persistent bowel leak. No evidence of bowel obstruction. No new bowel inflammatory change. Since CT dated 09/29/2015, there has been mild improvement in the inflammation adjacent to the sigmoid colon.  Intake/Output from previous day: 06/12 0701 - 06/13 0700 In: 410 [P.O.:410] Out: 30 [Drains:30] Intake/Output this shift:    General appearance: alert, cooperative and no distress Resp: clear to auscultation bilaterally Abd:  Soft, non tender, drains as noted above, yellow tinged purulent fluid from the right.   Lab Results:   Recent Labs  10/11/15 0857  WBC 19.0*  HGB 12.1*  HCT 37.3*  PLT 556*    BMET No results for input(s): NA, K, CL, CO2, GLUCOSE, BUN, CREATININE, CALCIUM in the last 72 hours. PT/INR No results for input(s): LABPROT, INR in the last 72 hours.   Recent Labs Lab 10/07/15 0841  AST 25  ALT 19  ALKPHOS 43  BILITOT 0.9  PROT 5.4*  ALBUMIN 2.0*     Lipase     Component Value Date/Time   LIPASE 16 09/29/2015 1845      Studies/Results: Ct Abdomen Pelvis W Contrast  10/12/2015  CLINICAL DATA:  Followup for diverticulitis. Patient is improved clinically. 30 mL of areola drainage from the JP drain this morning. EXAM: CT ABDOMEN AND PELVIS WITH CONTRAST TECHNIQUE: Multidetector CT imaging of the abdomen and pelvis was performed using the standard protocol following bolus administration of intravenous contrast. CONTRAST:  155mL ISOVUE-300 IOPAMIDOL (ISOVUE-300) INJECTION 61% COMPARISON:  10/06/2015 and 09/29/2015. FINDINGS: There is significant intraperitoneal air as well as air tracking along the abdominal wall. Air also tracks along the anterior 5 musculature. The amount of intraperitoneal air is slightly decreased from the prior study with the amount of abdominal wall air mildly increased. The surgical drains are stable. There is focal inflammatory type opacity with associated air in the posterior central to lower abdomen anterior to the region of the aortic bifurcation. This is without substantial change. There is no defined collection to suggest an abscess. Inflammatory changes adjacent to the sigmoid colon no mild improvement when compared to the study dated 09/29/2015. There are no new areas of colonic inflammation. There is no evidence of bowel obstruction. Lung bases: Small right and minimal left pleural effusions. Associated dependent subsegmental atelectasis, greater on the right. Hepatobiliary: Stable hepatic steatosis. No liver mass or focal lesion. Status post cholecystectomy. No bile duct dilation. Spleen, pancreas, adrenal glands:  Unremarkable. Kidneys, ureters, bladder:  Unremarkable. Lymph nodes:  No adenopathy.  Ascites:  No significant free fluid. IMPRESSION: 1. No defined abscess. 2. There is significant intraperitoneal air as well as abdominal wall air, overall similar in amount to the prior CT, with a slight decrease in the intraperitoneal air, but a mild increase in the abdominal wall air. The relative  persistence of this air since the laparoscopic procedure suggests a persistent bowel leak. 3. No evidence of bowel obstruction. No new bowel inflammatory change. Since CT dated 09/29/2015, there has been mild improvement in the inflammation adjacent to the sigmoid colon. Electronically Signed   By: Lajean Manes M.D.   On: 10/12/2015 18:03    Medications: . acetaminophen  650 mg Oral Q6H  . bethanechol  10 mg Oral TID  . enoxaparin (LOVENOX) injection  60 mg Subcutaneous Q24H  . ertapenem  1 g Intravenous Q24H  . famotidine  20 mg Oral Daily  . fluconazole (DIFLUCAN) IV  200 mg Intravenous Q24H  . lactose free nutrition  237 mL Oral TID WC & HS  . methocarbamol (ROBAXIN)  IV  1,000 mg Intravenous Q8H  . sodium chloride flush  3 mL Intravenous Q12H    Body mass index is 39.69 kg/(m^2).  Assessment/Plan Severe sigmoid diverticulitis with intra-abdominal abscess - admitted 09/29/15 S/p Laparoscopic drainage of abscess, intraperitoneal lavage, placement of drains, 10/02/15 Dr. Zella Richer Ongoing leukocytosis Malnutrition - prealbumin FEN: Soft diet 4.6 on 10/07/15 ID: Day 11 Invanz, (3 days Zosyn, discontinued on 10/02/15) DVT: Lovenox/SCD   Plan:  Continue current Rx, Dr. Lucia Gaskins will review film and discuss with him.  Labs still pending.      LOS: 14 days    [name],[name] 10/13/2015 [phone]  Agree with above.  Somewhat difficult problem, in that clinically her continues to look good.  His WBC is finally dropping.  But his CT scan yesterday showed multiple pockets of air - essentially unchanged from his last CT scan.  I think that he looks too good to have a continued air leak.  So I think that somewhat conservative management is working.  I talked to both Drs. Rosenbower (who did the wash out) and Dr. Hassell Done (who was the LDOW last week).  I received somewhat different opinions, but I think continued observation is in his best interest.  His wife was at the bedside and I  tried to answer questions.  Alphonsa Overall, MD, [facility] Surgery Pager: [phone] Office phone:  [phone]

## 2015-10-14 LAB — CBC
HCT: 37.9 % — ABNORMAL LOW (ref 39.0–52.0)
Hemoglobin: 11.8 g/dL — ABNORMAL LOW (ref 13.0–17.0)
MCH: 28.9 pg (ref 26.0–34.0)
MCHC: 31.1 g/dL (ref 30.0–36.0)
MCV: 92.7 fL (ref 78.0–100.0)
PLATELETS: 666 10*3/uL — AB (ref 150–400)
RBC: 4.09 MIL/uL — ABNORMAL LOW (ref 4.22–5.81)
RDW: 14.7 % (ref 11.5–15.5)
WBC: 17.6 10*3/uL — AB (ref 4.0–10.5)

## 2015-10-14 LAB — BASIC METABOLIC PANEL
ANION GAP: 9 (ref 5–15)
BUN: 9 mg/dL (ref 6–20)
CALCIUM: 8 mg/dL — AB (ref 8.9–10.3)
CO2: 29 mmol/L (ref 22–32)
Chloride: 98 mmol/L — ABNORMAL LOW (ref 101–111)
Creatinine, Ser: 0.97 mg/dL (ref 0.61–1.24)
GFR calc Af Amer: >60 mL/min (ref >60)
GLUCOSE: 125 mg/dL — AB (ref 65–99)
Potassium: 3.9 mmol/L (ref 3.5–5.1)
SODIUM: 136 mmol/L (ref 135–145)

## 2015-10-14 LAB — GLUCOSE, CAPILLARY: GLUCOSE-CAPILLARY: 125 mg/dL — AB (ref 65–99)

## 2015-10-14 MED ORDER — ACETAMINOPHEN 325 MG PO TABS
650.0000 mg | ORAL_TABLET | ORAL | Status: DC | PRN
  Administered 2015-10-14 – 2015-10-16 (×7): 650 mg via ORAL
  Filled 2015-10-14 (×7): qty 2

## 2015-10-14 NOTE — Progress Notes (Signed)
12 Days Post-Op  Subjective: He feels about the same, some wheezing this AM, he gets breathing treatments for this.  He feels about the same.  Drain on the right is yellow colored, the drain on the right now has some purulent fluid in it.    Objective: Vital signs in last 24 hours: Temp:  [98.3 F (36.8 C)-98.7 F (37.1 C)] 98.3 F (36.8 C) (06/14 0512) Pulse Rate:  [97-105] 97 (06/14 0512) Resp:  [20] 20 (06/14 0512) BP: (132-150)/(57-66) 132/66 mmHg (06/14 0512) SpO2:  [97 %-98 %] 97 % (06/14 0512) Last BM Date: 10/13/15 35 from the drain 1300 PO Urine x 1 recorded Stool x 1 recorded Afebrile, VSS WBC up to 17.6 today   Intake/Output from previous day: 06/13 0701 - 06/14 0700 In: [id number] [P.O.:1331; IV Piggyback:210] Out: 35 [Drains:35] Intake/Output this shift: Total I/O In: -  Out: 5 [Drains:5]  General appearance: alert, cooperative and no distress Resp: clear to auscultation bilaterally GI: soft, sore sites OK, drains as noted above.  Lab Results:   Recent Labs  10/13/15 1022 10/14/15 0524  WBC 14.0* 17.6*  HGB 12.3* 11.8*  HCT 36.9* 37.9*  PLT 634* 666*    BMET  Recent Labs  10/13/15 1022 10/14/15 0524  NA 136 136  K 3.6 3.9  CL 98* 98*  CO2 30 29  GLUCOSE 160* 125*  BUN 10 9  CREATININE 0.76 0.97  CALCIUM 8.0* 8.0*   PT/INR No results for input(s): LABPROT, INR in the last 72 hours.   Recent Labs Lab 10/13/15 1022  AST 42*  ALT 26  ALKPHOS 56  BILITOT 0.7  PROT 6.2*  ALBUMIN 2.3*     Lipase     Component Value Date/Time   LIPASE 16 09/29/2015 1845     Studies/Results: Ct Abdomen Pelvis W Contrast  10/12/2015  CLINICAL DATA:  Followup for diverticulitis. Patient is improved clinically. 30 mL of areola drainage from the JP drain this morning. EXAM: CT ABDOMEN AND PELVIS WITH CONTRAST TECHNIQUE: Multidetector CT imaging of the abdomen and pelvis was performed using the standard protocol following bolus administration of  intravenous contrast. CONTRAST:  140mL ISOVUE-300 IOPAMIDOL (ISOVUE-300) INJECTION 61% COMPARISON:  10/06/2015 and 09/29/2015. FINDINGS: There is significant intraperitoneal air as well as air tracking along the abdominal wall. Air also tracks along the anterior 5 musculature. The amount of intraperitoneal air is slightly decreased from the prior study with the amount of abdominal wall air mildly increased. The surgical drains are stable. There is focal inflammatory type opacity with associated air in the posterior central to lower abdomen anterior to the region of the aortic bifurcation. This is without substantial change. There is no defined collection to suggest an abscess. Inflammatory changes adjacent to the sigmoid colon no mild improvement when compared to the study dated 09/29/2015. There are no new areas of colonic inflammation. There is no evidence of bowel obstruction. Lung bases: Small right and minimal left pleural effusions. Associated dependent subsegmental atelectasis, greater on the right. Hepatobiliary: Stable hepatic steatosis. No liver mass or focal lesion. Status post cholecystectomy. No bile duct dilation. Spleen, pancreas, adrenal glands:  Unremarkable. Kidneys, ureters, bladder:  Unremarkable. Lymph nodes:  No adenopathy. Ascites:  No significant free fluid. IMPRESSION: 1. No defined abscess. 2. There is significant intraperitoneal air as well as abdominal wall air, overall similar in amount to the prior CT, with a slight decrease in the intraperitoneal air, but a mild increase in the abdominal wall air. The relative  persistence of this air since the laparoscopic procedure suggests a persistent bowel leak. 3. No evidence of bowel obstruction. No new bowel inflammatory change. Since CT dated 09/29/2015, there has been mild improvement in the inflammation adjacent to the sigmoid colon. Electronically Signed   By: Lajean Manes M.D.   On: 10/12/2015 18:03    Medications: . acetaminophen  650  mg Oral Q6H  . bethanechol  10 mg Oral TID  . enoxaparin (LOVENOX) injection  60 mg Subcutaneous Q24H  . ertapenem  1 g Intravenous Q24H  . famotidine  20 mg Oral Daily  . fluconazole (DIFLUCAN) IV  200 mg Intravenous Q24H  . lactose free nutrition  237 mL Oral TID WC & HS  . methocarbamol (ROBAXIN)  IV  1,000 mg Intravenous Q8H  . sodium chloride flush  3 mL Intravenous Q12H     Assessment/Plan Severe sigmoid diverticulitis with intra-abdominal abscess - admitted 09/29/15 S/p Laparoscopic drainage of abscess, intraperitoneal lavage, placement of drains, 10/02/15 Dr. Zella Richer Ongoing leukocytosis Malnutrition - prealbumin FEN: Soft diet 4.6 on 10/07/15 ID: Day 12 Invanz, (3 days Zosyn, discontinued on 10/02/15) diflucan day 8 DVT: Lovenox/SCD    Continuing antibiotic therapy and drains.  Will discuss with Dr.Bransyn Adami.    LOS: 15 days    Eddie Foley 10/14/2015 [phone]  He clinically looks good.  He is not febrile, nor tachcardic. His WBC bounces around.  His wife is concerned because of what she perceives as a lack of progress.  There are really three options - continue current course with antibiotics vs drainage of abscess (but we did CT scan on Monday, 6/12, and there was no abscess to drain) vs surgery.  I outlined a short term plan: 1)  Switch to oral antibioitics in the AM. 2)  Plan discharge for Friday. 3)  Follow up CT scan next week with possible study through the drain. 4)  Follow up with Dr. Zella Richer after CT scan  I think that they understand the plan.  I think they understand that things could change.  Alphonsa Overall, MD, [facility] Surgery Pager: [phone] Office phone:  [phone]

## 2015-10-15 ENCOUNTER — Other Ambulatory Visit: Payer: Self-pay | Admitting: Radiology

## 2015-10-15 ENCOUNTER — Encounter (HOSPITAL_COMMUNITY): Payer: Self-pay | Admitting: General Surgery

## 2015-10-15 DIAGNOSIS — E669 Obesity, unspecified: Secondary | ICD-10-CM

## 2015-10-15 DIAGNOSIS — E46 Unspecified protein-calorie malnutrition: Secondary | ICD-10-CM

## 2015-10-15 DIAGNOSIS — D72829 Elevated white blood cell count, unspecified: Secondary | ICD-10-CM | POA: Diagnosis present

## 2015-10-15 DIAGNOSIS — K5720 Diverticulitis of large intestine with perforation and abscess without bleeding: Secondary | ICD-10-CM

## 2015-10-15 LAB — CBC
HCT: 33.5 % — ABNORMAL LOW (ref 39.0–52.0)
Hemoglobin: 10.9 g/dL — ABNORMAL LOW (ref 13.0–17.0)
MCH: 29.1 pg (ref 26.0–34.0)
MCHC: 32.5 g/dL (ref 30.0–36.0)
MCV: 89.3 fL (ref 78.0–100.0)
Platelets: 592 10*3/uL — ABNORMAL HIGH (ref 150–400)
RBC: 3.75 MIL/uL — ABNORMAL LOW (ref 4.22–5.81)
RDW: 14.5 % (ref 11.5–15.5)
WBC: 14.5 10*3/uL — ABNORMAL HIGH (ref 4.0–10.5)

## 2015-10-15 LAB — GLUCOSE, CAPILLARY: GLUCOSE-CAPILLARY: 151 mg/dL — AB (ref 65–99)

## 2015-10-15 MED ORDER — AMOXICILLIN-POT CLAVULANATE 875-125 MG PO TABS
1.0000 | ORAL_TABLET | Freq: Two times a day (BID) | ORAL | Status: DC
  Administered 2015-10-15 – 2015-10-16 (×3): 1 via ORAL
  Filled 2015-10-15 (×4): qty 1

## 2015-10-15 NOTE — Care Management Note (Signed)
Case Management Note  Patient Details  Name: JAECION ALBINI MRN: [id number] Date of Birth: 02-Sep-1955  Subjective/Objective:  HHC, & home rw ordered. Spoke to patient, & spouse(who is a Marine scientist) about d/c plans & HHC/dme-they pleasantly decline HHC, & rw-spouse feels she can manage his nursing needs, PT recc otpt PT-Please provide a manual script for otpt PT w/dx.Nsg made aware. Patient states he has benn ambulating up & down the halls independently.Spouse has a rw already, but I have asked PT to look @ patients home rw to confirm another home rw is not needed. Nsg notified of current status.Family able to transport home on own.                 Action/Plan:d/c home in am.   Expected Discharge Date:                  Expected Discharge Plan:  Home/Self Care  In-House Referral:  NA  Discharge planning Services  CM Consult  Post Acute Care Choice:  NA, Resumption of Svcs/PTA Provider (otpt PT(wife is already getting set up for 6/20 @ [facility])) Choice offered to:  NA  DME Arranged:  N/A DME Agency:  NA  HH Arranged:  NA, Patient Refused [place] Agency:  NA  Status of Service:  Completed, signed off  Medicare Important Message Given:    Date Medicare IM Given:    Medicare IM give by:    Date Additional Medicare IM Given:    Additional Medicare Important Message give by:     If discussed at [place] of Stay Meetings, dates discussed:    Additional Comments:  Dessa Phi, RN 10/15/2015, 1:52 PM

## 2015-10-15 NOTE — Progress Notes (Signed)
LCSWA provided patient daughter with list of [facility] requested by family.  Kathrin Greathouse, Latanya Presser, MSW Clinical Social Worker 5E and Psychiatric Service Line [phone] 10/15/2015  1:49 PM

## 2015-10-15 NOTE — Progress Notes (Signed)
13 Days Post-Op  Subjective: He is eating very little, and taking some of the supplements, says he has no appetite, and fill very easily.  He has a longstanding neuropathy and has been using a cane at home.  Now using a walker.  Getting PT and they are recommending home PT.   He doesn't have much drainage, in either drain.  The left drain was draining allot around it, that they report was serous.  The Left drain now has some purulent fluid in it, but the volume is very small.    Objective: Vital signs in last 24 hours: Temp:  [98.5 F (36.9 C)-99.1 F (37.3 C)] 98.5 F (36.9 C) (06/15 0557) Pulse Rate:  [83-116] 96 (06/15 0557) Resp:  [18-19] 18 (06/15 0557) BP: (120-129)/(65-70) 120/70 mmHg (06/15 0557) SpO2:  [94 %-96 %] 96 % (06/15 0557) Last BM Date: 10/15/15 120 PO recorded Void x 2 5 ml from drains recorded  Afebrile, VSS WBC improving Intake/Output from previous day: 06/14 0701 - 06/15 0700 In: 120 [P.O.:120] Out: 5 [Drains:5] Intake/Output this shift: Total I/O In: 120 [P.O.:120] Out: 10 [Drains:10]  General appearance: alert, cooperative, no distress and he is always in bed and now I am assuming it's because of the neuropathy issue.  Resp: clear to auscultation bilaterally Abdomen:  Still large and somewhat distended, soft, not really tender.  Few BS. Drains with very little in them but right drain yellowish purulent fluid, left drain now has some purulent fluid in it.    Lab Results:   Recent Labs  10/14/15 0524 10/15/15 0504  WBC 17.6* 14.5*  HGB 11.8* 10.9*  HCT 37.9* 33.5*  PLT 666* 592*    BMET  Recent Labs  10/13/15 1022 10/14/15 0524  NA 136 136  K 3.6 3.9  CL 98* 98*  CO2 30 29  GLUCOSE 160* 125*  BUN 10 9  CREATININE 0.76 0.97  CALCIUM 8.0* 8.0*   PT/INR No results for input(s): LABPROT, INR in the last 72 hours.   Recent Labs Lab 10/13/15 1022  AST 42*  ALT 26  ALKPHOS 56  BILITOT 0.7  PROT 6.2*  ALBUMIN 2.3*     Lipase     Component Value Date/Time   LIPASE 16 09/29/2015 1845     Studies/Results: No results found.  Medications: . amoxicillin-clavulanate  1 tablet Oral Q12H  . bethanechol  10 mg Oral TID  . enoxaparin (LOVENOX) injection  60 mg Subcutaneous Q24H  . famotidine  20 mg Oral Daily  . lactose free nutrition  237 mL Oral TID WC & HS  . sodium chloride flush  3 mL Intravenous Q12H    Assessment/Plan Severe sigmoid diverticulitis with intra-abdominal abscess - admitted 09/29/15 S/p Laparoscopic drainage of abscess, intraperitoneal lavage, placement of drains, 10/02/15 Dr. Zella Richer Ongoing leukocytosis Bilateral lower extremity neuropathy  Malnutrition - prealbumin 8.8  Neuropathy - uses cane at home ?? FEN: Soft diet 4.6 on 10/07/15 ID: Day 12 Invanz, (3 days Zosyn, discontinued on 10/02/15) diflucan day 8 discontinued on 10/15/15, Day 1 Augmentin  DVT: Lovenox/SCD   Plan:  We have switched him to Augmentin this AM.  I will recheck labs.  If he does well we aim to send him home tomorrow.  CT scan as OP with the drain clinic and follow up with Dr. Zella Richer.        LOS: 16 days    [name],[name] 10/15/2015 [phone]  Agree with above.  Alphonsa Overall, MD, [facility] Surgery  Pager: [phone] Office phone:  [phone]

## 2015-10-15 NOTE — Progress Notes (Signed)
Nutrition Follow-up  DOCUMENTATION CODES:   Obesity unspecified  INTERVENTION:  -Boost Plus QID, each supplement provides 360 calories and 13 grams of protein  NUTRITION DIAGNOSIS:   Inadequate oral intake related to poor appetite, acute illness as evidenced by per patient/family report. -ongoing GOAL:   Patient will meet greater than or equal to 90% of their needs -not meeting  MONITOR:   PO intake, Labs, Supplement acceptance, I & O's, Weight trends  REASON FOR ASSESSMENT:   LOS    ASSESSMENT:   He is a 60 year old male with no previous history of diverticulitis. 2 days ago he had the gradual onset of pain which was initially in his lower mid abdomen and left lower quadrant.The following morning, yesterday, the pain was much more severe. It has been constant ever since. Since yesterday the pain has been radiating up into his upper abdomen and his left upper quadrant.  Eddie Foley remains on observation s/p laparoscopic drainage of abscess, intraperitoneal lavage, placement of drains. PO intake has varied for him, but mostly, he states he gets full quickly. His appetite hasn't been great as well, pt states he "doesn't feel good" - ongoing leukocytosis and purulent drainage noted by surgery.  This morning he had grits, scrambled eggs, and fruit that he didn't eat much of.  Last night he had Macaroni and cheese and cornbread dressing he states he ate most of.  Appears he does better with meals later in the day. He is consuming the boost plus when he isn't hungry, but has a hard time consuming 100% of that as well.  Per surgery, he may discharge tomorrow.  Labs and medications reviewed  Diet Order:  DIET SOFT Room service appropriate?: Yes; Fluid consistency:: Thin  Skin:  Reviewed, no issues  Last BM:  10/06/2015  Height:   Ht Readings from Last 1 Encounters:  09/29/15 5\' 8"  (1.727 m)    Weight:   Wt Readings from Last 1 Encounters:  09/29/15 261 lb (118.389 kg)     Ideal Body Weight:  70 kg  BMI:  Body mass index is 39.69 kg/(m^2).  Estimated Nutritional Needs:   Kcal:  1650-2050  Protein:  70-85 grams  Fluid:  >/= 1.65L  EDUCATION NEEDS:   Education needs addressed  Eddie Anis. Cybil Sen[place], MS, RD LDN Inpatient Clinical Dietitian Pager [phone]

## 2015-10-15 NOTE — Discharge Instructions (Signed)
Diverticulitis Diverticulitis is inflammation or infection of small pouches in your colon that form when you have a condition called diverticulosis. The pouches in your colon are called diverticula. Your colon, or large intestine, is where water is absorbed and stool is formed. Complications of diverticulitis can include:  Bleeding.  Severe infection.  Severe pain.  Perforation of your colon.  Obstruction of your colon. CAUSES  Diverticulitis is caused by bacteria. Diverticulitis happens when stool becomes trapped in diverticula. This allows bacteria to grow in the diverticula, which can lead to inflammation and infection. RISK FACTORS People with diverticulosis are at risk for diverticulitis. Eating a diet that does not include enough fiber from fruits and vegetables may make diverticulitis more likely to develop. SYMPTOMS  Symptoms of diverticulitis may include:  Abdominal pain and tenderness. The pain is normally located on the left side of the abdomen, but may occur in other areas.  Fever and chills.  Bloating.  Cramping.  Nausea.  Vomiting.  Constipation.  Diarrhea.  Blood in your stool. DIAGNOSIS  Your health care provider will ask you about your medical history and do a physical exam. You may need to have tests done because many medical conditions can cause the same symptoms as diverticulitis. Tests may include:  Blood tests.  Urine tests.  Imaging tests of the abdomen, including X-rays and CT scans. When your condition is under control, your health care provider may recommend that you have a colonoscopy. A colonoscopy can show how severe your diverticula are and whether something else is causing your symptoms. TREATMENT  Most cases of diverticulitis are mild and can be treated at home. Treatment may include:  Taking over-the-counter pain medicines.  Following a clear liquid diet.  Taking antibiotic medicines by mouth for 7-10 days. More severe cases may  be treated at a hospital. Treatment may include:  Not eating or drinking.  Taking prescription pain medicine.  Receiving antibiotic medicines through an IV tube.  Receiving fluids and nutrition through an IV tube.  Surgery. HOME CARE INSTRUCTIONS   Follow your health care provider's instructions carefully.  Follow a full liquid diet or other diet as directed by your health care provider. After your symptoms improve, your health care provider may tell you to change your diet. He or she may recommend you eat a high-fiber diet. Fruits and vegetables are good sources of fiber. Fiber makes it easier to pass stool.  Take fiber supplements or probiotics as directed by your health care provider.  Only take medicines as directed by your health care provider.  Keep all your follow-up appointments. SEEK MEDICAL CARE IF:   Your pain does not improve.  You have a hard time eating food.  Your bowel movements do not return to normal. SEEK IMMEDIATE MEDICAL CARE IF:   Your pain becomes worse.  Your symptoms do not get better.  Your symptoms suddenly get worse.  You have a fever.  You have repeated vomiting.  You have bloody or black, tarry stools. MAKE SURE YOU:   Understand these instructions.  Will watch your condition.  Will get help right away if you are not doing well or get worse.   This information is not intended to replace advice given to you by your health care provider. Make sure you discuss any questions you have with your health care provider.   Document Released: 01/26/2005 Document Revised: 04/23/2013 Document Reviewed: 03/13/2013 Elsevier Interactive Patient Education 2016 Elsevier Inc.  Percutaneous Abscess Drain Keep a daily record of  the drainage from each drain: color, volume, odor. Call if there is any significant change. An abscess is a collection of infected fluid inside the body. Your health care provider may decide to remove or drain the infected  fluid from the area by placing a thin needle into the abscess. Usually, a small tube is left in place to drain the abscess fluid. The abscess fluid may take a few days to drain. LET [facility] CARE PROVIDER KNOW ABOUT:  Any allergies you have.  All medicines you are taking, including vitamins, herbs, eye drops, creams, and over-the-counter medicines. This includes steroid medicines by mouth or cream.  Previous problems you or members of your family have had with the use of anesthetics.  Any blood disorders you have.  Previous surgeries you have had.  Possibility of pregnancy, if this applies.  Medical conditions you have.  Any history of smoking. RISKS AND COMPLICATIONS Generally, this is a safe procedure. However, problems can occur and include:   Infection.  Allergic reaction to materials used (such as contrast dye).  Damage to a nearby organ or tissue.  Bleeding.  Blockage of a tube placed to drain the abscess, requiring placement of a new drainage tube.  A need to repeat the procedure.  Failure of the procedure to adequately drain the abscess, requiring an open surgical procedure to do so. BEFORE THE PROCEDURE   Ask your health care provider about:  Changing or stopping your regular medicines. This is especially important if you are taking diabetes medicines or blood thinners.  Taking medicines such as aspirin and ibuprofen. These medicines can thin your blood. Do not take these medicines before your procedure if your health care provider asks you not to.  Your health care provider may do some blood or urine tests. These will help your health care provider learn how well your kidneys and liver are working and how well your blood clots.  Do not eat or drink anything after midnight on the night before the procedure or as directed by your health care provider.  Make arrangements for someone to drive you home after the procedure.  PROCEDURE   An IV tube will be  placed in your arm. Medicine will be able to flow directly into your body through this tube.  You will lie on an X-ray table.  Your heart rate, blood pressure, and breathing will be monitored.   Your oxygen level will also be watched during the procedure. Supplemental oxygen may be given if necessary.  The skin around the area where the drainage tube (catheter) will be placed will be cleaned and numbed.  A small cut (incision) will then be made to insert the drainage tube. The drainage tube will be inserted using X-ray or CT scan to help direct where it should be placed.  The drainage tube will be guided into the abscess to drain the infected fluid.  The drainage tube may stay in place and be connected to a bag outside your body. It will stay until the fluid has stopped draining and the infection is gone. AFTER THE PROCEDURE  You will be taken to a recovery area where you will stay until the medicines have worn off.  You will stay in bed for several hours.  Your progress will be monitored.   Your blood pressure and pulse will be checked often.   The area of the incision will be checked often.  You may have some pain or feel sick. Tell your health care  provider.  As you begin to feel better, you may be given ice, fluids, and food.   When you can walk, drink, eat, and use the bathroom, you may be able to go home.   This information is not intended to replace advice given to you by your health care provider. Make sure you discuss any questions you have with your health care provider.   Document Released: 09/02/2013 Document Reviewed: 09/02/2013 Elsevier Interactive Patient Education [employer].

## 2015-10-15 NOTE — Discharge Summary (Signed)
Physician Discharge Summary  Patient ID: Eddie Foley MRN: [id number] DOB/AGE: 62-Dec-1957 62 y.o.  Admit date: 09/29/2015 Discharge date: 10/16/2015  Admission Diagnoses:  Severe sigmoid diverticulitis with perforation and abscess  Discharge Diagnoses:  Severe sigmoid diverticulitis with intra-abdominal abscess - admitted 09/29/15 S/p Laparoscopic drainage of abscess, intraperitoneal lavage, placement of drains, 10/02/15 Dr. Zella Richer Ongoing leukocytosis Bilateral lower extremity neuropathy  Malnutrition - prealbumin 8.8 Body mass index is 39.69   Principal Problem:   Diverticulitis of large intestine with perforation and abscess Active Problems:   Paraparesis of both lower limbs (HCC)   Leukocytosis   Obesity   Protein-calorie malnutrition (HCC)   G E R D   PROCEDURES:   Laparoscopic drainage of abscess, intraperitoneal lavage, placement of drains, 09/01/15, Dr. Lenice Llamas Course:  He is a 60 year old male with no previous history of diverticulitis. 2 days ago he had the gradual onset of pain which was initially in his lower mid abdomen and left lower quadrant.The following morning, yesterday, the pain was much more severe. It has been constant ever since. Since yesterday the pain has been radiating up into his upper abdomen and his left upper quadrant. Worse with any motion. He has also had fever and chills yesterday and today. He thought maybe it was a prostate infection as he had had before the pain became much worse. His wife states that for several weeks to possibly 2-3 months he has been having some difficulty with bowel movements with loose stools and occasional small amount of blood and a burning pain at his rectum with bowel movements. His symptoms continued today and he presented to [facility] for evaluation. With CT scan findings as Below he was transferred to [facility] for emergency admission. No urinary symptoms.  He made some  progress over the first 48 hours on IV Zosyn.  He spiked fevers on 10/01/25, early AM and was seen early by Dr. Zella Richer.  He was taken to the OR at that point for laparoscopic drainage of the the intraabdominal abscesses that were not accessible by the IR drainage.  He improved and made some progress slowly.    He has had continued leukocytosis, fevers, nausea, pain and urinary retention post op requiring treatment, including reinsertion of his foley.  He started developing diarrhea on 10/08/15. Post op hypokalemia was also treated. Zosyn was converted to Invanz on day of surgery, and Diflucan was also added.  Drainage from the two JP's left in place at surgery had been clear and serous up until the afternoon of 10/11/15 at which time his wife who is with him daily noted the right one turned colors and became purulent white colored fluid.  At this point he had been advanced to a soft diet even though he was not taking in much PO.  He had an ongoing leukocytosis and low grade fever again on 10/12/15.  We repeated his CT scan at that point.  This showed no define abscess, significant intraperitoneal air and abdominal wall air.  No evidence of bowel obstruction.  Mild improvement since 09/29/15 CT scan.  He continued to have some Leukocytosis.  Dr. Lucia Gaskins discussed with both Dr. Hassell Done and Dr. Teola Bradley.    His exam was stable, but there was ongoing concern with output from the drain on the right.  Leukocytosis remains elevated but continue to go up and down.  We stopped the Invanz on 6/15 and converted him to PO Augmentin.  If he does well  we aim to send home tomorrow.  We are going to arrange thru the drain clinic another CT scan and injection of the drains to evaluate for fistula formation.  He will see Dr. Zella Richer on 10/23/15 at 11 AM.  Sooner if there is an issue. He was having loose stools and we checked a C Diff prior to discharge.  It was negative.     CBC Latest Ref Rng 10/15/2015 10/14/2015 10/13/2015   WBC 4.0 - 10.5 K/uL 14.5(H) 17.6(H) 14.0(H)  Hemoglobin 13.0 - 17.0 g/dL 10.9(L) 11.8(L) 12.3(L)  Hematocrit 39.0 - 52.0 % 33.5(L) 37.9(L) 36.9(L)  Platelets 150 - 400 K/uL 592(H) 666(H) 634(H)   CMP Latest Ref Rng 10/14/2015 10/13/2015 10/09/2015  Glucose 65 - 99 mg/dL 125(H) 160(H) 129(H)  BUN 6 - 20 mg/dL 9 10 8   Creatinine 0.61 - 1.24 mg/dL 0.97 0.76 0.85  Sodium 135 - 145 mmol/L 136 136 138  Potassium 3.5 - 5.1 mmol/L 3.9 3.6 3.7  Chloride 101 - 111 mmol/L 98(L) 98(L) 102  CO2 22 - 32 mmol/L 29 30 30   Calcium 8.9 - 10.3 mg/dL 8.0(L) 8.0(L) 7.7(L)  Total Protein 6.5 - 8.1 g/dL - 6.2(L) -  Total Bilirubin 0.3 - 1.2 mg/dL - 0.7 -  Alkaline Phos 38 - 126 U/L - 56 -  AST 15 - 41 U/L - 42(H) -  ALT 17 - 63 U/L - 26 -   Prealbumin on 10/13/15 was 8.8.     Disposition: 01-Home or Self Care      Discharge Instructions    Care order/instruction    Complete by:  As directed   Clean drain sites with soap and water, change daily and as needed for drainage around the drain.    CALL if there is a change in the drainage, new fever, increased pain.     For home use only DME Walker rolling    Complete by:  As directed             Medication List    STOP taking these medications        OVER THE COUNTER MEDICATION      TAKE these medications        acetaminophen 325 MG tablet  Commonly known as:  TYLENOL  Take 2 tablets (650 mg total) by mouth every 4 (four) hours as needed for mild pain, moderate pain or fever.     albuterol 108 (90 Base) MCG/ACT inhaler  Commonly known as:  PROVENTIL HFA;VENTOLIN HFA  Inhale 2 puffs into the lungs every 4 (four) hours as needed for wheezing.     amoxicillin-clavulanate 875-125 MG tablet  Commonly known as:  AUGMENTIN  Take 1 tablet by mouth every 12 (twelve) hours.  He has 15 days with one refill     EPINEPHrine 0.3 mg/0.3 mL Soaj injection  Commonly known as:  EPI-PEN  Inject 0.3 mLs (0.3 mg total) into the muscle once.      famotidine 20 MG tablet  Commonly known as:  PEPCID  Take 20 mg by mouth daily as needed for heartburn or indigestion.     NEXIUM 40 MG capsule  Generic drug:  esomeprazole  TAKE ONE CAPSULE BY MOUTH ONCE DAILY     ondansetron 4 MG disintegrating tablet  Commonly known as:  ZOFRAN-ODT   20 tablets ordered  Take 1 tablet (4 mg total) by mouth every 8 (eight) hours as needed for nausea.     oxyCODONE 5 MG immediate release tablet  30 tablets ordered  Commonly known as:  Oxy IR/ROXICODONE  Take 1-2 tablets (5-10 mg total) by mouth every 4 (four) hours as needed for moderate pain, severe pain or breakthrough pain.       Follow-up Information    Follow up with Odis Hollingshead, MD On 10/23/2015.   Specialty:  General Surgery   Why:  You have an appointment at [facility] to see Dr. Zella Richer.  You should have a CT scan before you come in to see him.  Call if there is an issue.  Call for fevers, chills or any increased abdominal disomfort.     Contact information:   [address] [place] [address] [phone]       Follow up with Sallee Lange, MD.   Specialty:  Family Medicine   Why:  Call for follow up and let him see you after your discharged fromt the hospital.     Contact information:   [place] [address] [phone]       Follow up with CT scan will be arranged prior to your return to see Dr. Zella Richer.  .   Why:  If you do not hear from Radiology by Monday PM, call our office and let them check for you.      SignedEarnstine Regal 10/16/2015, 4:46 PM  Agree with above. Unfortunately, at the time of this signing, I'm aware that Mr. Corkran re-presented to the hospital acutely and died.  Alphonsa Overall, MD, [facility] Surgery Pager: [phone] Office phone:  [phone]

## 2015-10-15 NOTE — Care Management Note (Signed)
Case Management Note  Patient Details  Name: Eddie Foley MRN: [id number] Date of Birth: [birth year]  Subjective/Objective:  PT has seen patient's home rw which is fine to use for home.Disregard home rw order. Patient/family notified.Family declines HHC-has family support. MD please provide manual script for otpt PT.                  Action/Plan:d/c home no needs.   Expected Discharge Date:                  Expected Discharge Plan:  Home/Self Care  In-House Referral:  NA  Discharge planning Services  CM Consult  Post Acute Care Choice:  NA, Resumption of Svcs/PTA Provider (otpt PT(wife is already getting set up for 6/20 @ [facility])) Choice offered to:  NA  DME Arranged:  N/A DME Agency:  NA  HH Arranged:  NA, Patient Refused [place] Agency:  NA  Status of Service:  Completed, signed off  Medicare Important Message Given:    Date Medicare IM Given:    Medicare IM give by:    Date Additional Medicare IM Given:    Additional Medicare Important Message give by:     If discussed at [place] of Stay Meetings, dates discussed:    Additional Comments:  Dessa Phi, RN 10/15/2015, 2:39 PM

## 2015-10-16 LAB — C DIFFICILE QUICK SCREEN W PCR REFLEX
C DIFFICLE (CDIFF) ANTIGEN: NEGATIVE
C Diff interpretation: NEGATIVE
C Diff toxin: NEGATIVE

## 2015-10-16 MED ORDER — ONDANSETRON 4 MG PO TBDP: 4.0000 mg | ORAL_TABLET | Freq: Three times a day (TID) | ORAL | Status: AC | PRN

## 2015-10-16 MED ORDER — AMOXICILLIN-POT CLAVULANATE 875-125 MG PO TABS: 1.0000 | ORAL_TABLET | Freq: Two times a day (BID) | ORAL | Status: AC

## 2015-10-16 MED ORDER — ACETAMINOPHEN 325 MG PO TABS: 650.0000 mg | ORAL_TABLET | ORAL | Status: DC | PRN

## 2015-10-16 MED ORDER — AMOXICILLIN-POT CLAVULANATE 875-125 MG PO TABS
1.0000 | ORAL_TABLET | Freq: Two times a day (BID) | ORAL | Status: DC
Start: 2015-10-16 — End: 2015-10-16

## 2015-10-16 MED ORDER — OXYCODONE HCL 5 MG PO TABS: 5.0000 mg | ORAL_TABLET | ORAL | Status: AC | PRN

## 2015-10-16 MED ORDER — ONDANSETRON 4 MG PO TBDP: 4.0000 mg | ORAL_TABLET | Freq: Three times a day (TID) | ORAL | Status: DC | PRN

## 2015-10-16 MED ORDER — ACETAMINOPHEN 325 MG PO TABS: 650.0000 mg | ORAL_TABLET | ORAL | Status: AC | PRN

## 2015-10-16 NOTE — Progress Notes (Addendum)
14 Days Post-Op  Subjective: He reports multiple loose stools yesterday and his wife said 3 this AM so far.  He thinks it might be the Augmentin, but has been on antibiotics since 09/29/15.  Drain on right still has purulent drainage in it, yellow tinged, but no odor.  The right drain now has some purulent fluid in it also.  Abdomen is about the same, he still isn't eating much.  He had arrangements to go to Rehab in [place], they rescheduled when he got admitted here.  I ask his wife to see if they could not reschedule for next week.  This is being supervised by neurology.    Objective: Vital signs in last 24 hours: Temp:  [98 F (36.7 C)-99.5 F (37.5 C)] 98.2 F (36.8 C) (06/16 0618) Pulse Rate:  [93-99] 96 (06/16 0618) Resp:  [18] 18 (06/16 0618) BP: (116-151)/(59-125) 135/63 mmHg (06/16 0618) SpO2:  [97 %-98 %] 98 % (06/16 0618) Last BM Date: 10/15/15   600 PO Drain 35 Afebrile, VSS No labs  Intake/Output from previous day: 06/15 0701 - 06/16 0700 In: 600 [P.O.:600] Out: 35 [Drains:35] Intake/Output this shift:    General appearance: alert, cooperative and no distress Chest wall: no tenderness, lungs clear GI: soft, large distended abdomen, with a few BS, having multiple loose stools.  drains as noted above.  Lab Results:   Recent Labs  10/14/15 0524 10/15/15 0504  WBC 17.6* 14.5*  HGB 11.8* 10.9*  HCT 37.9* 33.5*  PLT 666* 592*    BMET  Recent Labs  10/13/15 1022 10/14/15 0524  NA 136 136  K 3.6 3.9  CL 98* 98*  CO2 30 29  GLUCOSE 160* 125*  BUN 10 9  CREATININE 0.76 0.97  CALCIUM 8.0* 8.0*   PT/INR No results for input(s): LABPROT, INR in the last 72 hours.   Recent Labs Lab 10/13/15 1022  AST 42*  ALT 26  ALKPHOS 56  BILITOT 0.7  PROT 6.2*  ALBUMIN 2.3*     Lipase     Component Value Date/Time   LIPASE 16 09/29/2015 1845     Studies/Results: No results found.  Medications: . amoxicillin-clavulanate  1 tablet Oral Q12H  .  bethanechol  10 mg Oral TID  . enoxaparin (LOVENOX) injection  60 mg Subcutaneous Q24H  . famotidine  20 mg Oral Daily  . lactose free nutrition  237 mL Oral TID WC & HS  . sodium chloride flush  3 mL Intravenous Q12H    Assessment/Plan Severe sigmoid diverticulitis with intra-abdominal abscess - admitted 09/29/15 S/p Laparoscopic drainage of abscess, intraperitoneal lavage, placement of drains, 10/02/15 Dr. Zella Richer POD 14 Ongoing leukocytosis Bilateral lower extremity neuropathy  Malnutrition - prealbumin 8.8  Neuropathy - uses cane at home ?? FEN: Soft diet 4.6 on 10/07/15 ID: Day 12 Invanz, (3 days Zosyn, discontinued on 10/02/15) diflucan day 8 discontinued on 10/15/15, Day 2 Augmentin  DVT: Lovenox/SCD    Plan:  Check C diff and let Dr. Lucia Gaskins see him.  If C diff is negative aim for D/C later  Follow up is arranged.  Dr.Rosenbower on 6/23, with CT arranged thru the [facility] radiology.    LOS: 17 days    Foley,Eddie 10/16/2015 [phone]  Agree with above.  He has had some loose stools and he has C. Diff pending. He does not look sick, has no abdominal pain, and I don't think that he has C. Diff.  Labs pending.  I spoke with wife  and patient about plan.  One complicating problem is his neurologic issues with his lower extremities.  It is okay for him to have home PT.  Alphonsa Overall, MD, [facility] Surgery Pager: [phone] Office phone:  [phone]

## 2015-10-19 ENCOUNTER — Encounter: Payer: Self-pay | Admitting: Family Medicine

## 2015-10-19 ENCOUNTER — Other Ambulatory Visit: Payer: Self-pay | Admitting: General Surgery

## 2015-10-19 ENCOUNTER — Ambulatory Visit (INDEPENDENT_AMBULATORY_CARE_PROVIDER_SITE_OTHER): Payer: BLUE CROSS/BLUE SHIELD | Admitting: Family Medicine

## 2015-10-19 VITALS — BP 122/76 | Temp 98.6°F | Ht 67.0 in | Wt 238.0 lb

## 2015-10-19 DIAGNOSIS — E440 Moderate protein-calorie malnutrition: Secondary | ICD-10-CM

## 2015-10-19 DIAGNOSIS — R7303 Prediabetes: Secondary | ICD-10-CM

## 2015-10-19 DIAGNOSIS — D72829 Elevated white blood cell count, unspecified: Secondary | ICD-10-CM

## 2015-10-19 DIAGNOSIS — R059 Cough, unspecified: Secondary | ICD-10-CM

## 2015-10-19 DIAGNOSIS — R109 Unspecified abdominal pain: Secondary | ICD-10-CM

## 2015-10-19 DIAGNOSIS — R05 Cough: Secondary | ICD-10-CM | POA: Diagnosis not present

## 2015-10-19 DIAGNOSIS — K5720 Diverticulitis of large intestine with perforation and abscess without bleeding: Secondary | ICD-10-CM

## 2015-10-19 MED ORDER — BENZONATATE 100 MG PO CAPS: 100.0000 mg | ORAL_CAPSULE | Freq: Four times a day (QID) | ORAL | Status: AC | PRN

## 2015-10-19 MED ORDER — HYDROCODONE-ACETAMINOPHEN 10-325 MG PO TABS: 1.0000 | ORAL_TABLET | ORAL | Status: AC | PRN

## 2015-10-19 MED ORDER — BENZONATATE 100 MG PO CAPS: 100.0000 mg | ORAL_CAPSULE | Freq: Three times a day (TID) | ORAL | Status: AC | PRN

## 2015-10-19 MED ORDER — ALBUTEROL SULFATE HFA 108 (90 BASE) MCG/ACT IN AERS: 2.0000 | INHALATION_SPRAY | RESPIRATORY_TRACT | Status: AC | PRN

## 2015-10-19 NOTE — Progress Notes (Addendum)
   Subjective:    Patient ID: Eddie Foley, male    DOB: [birth year], 61 y.o.   MRN: [id number]  HPIFollow up severe sigmoid diverticulitis with perforation and abscess.   Cough and chest congestion. Got worse while in the hospital. Had to breathing treatments in the hospital. Thick white sputum.   Toe pain in left foot. Hurts for sock to touch toe.   Needs handicap parking form filled out and fmla papers.   Needs refill on albuterol inhaler.  States severe weakness and fatigue and tiredness. Poor appetite. On a soft liquid.diet. Does use some protein supplements boost. A review of his hospitalization along with all testing was done with the patient. Review of the risk of bleeding as well as risk of worsening lung related issues and infection as well as C. difficile was discussed with the patient at length  Review of Systems Patient denies chest tightness does relate cough with clear phlegm denies any chest pressure denies fever chills. Relates abdominal pain when he coughs. Coughs more at nighttime. Relates minimal swelling in the ankles.    Objective:   Physical Exam  Lungs are clear no crackles respiratory rate normal heart is regular no murmurs abdomen has a little bit of bruising he has 2 drains that are coming out no sign of skin infection extremities small amount of edema in the ankles Significant muscle wasting from recent illness  25 minutes was spent with the patient. Greater than half the time was spent in discussion and answering questions and counseling regarding the issues that the patient came in for today.    I find no evidence of any type of gout in his foot I think this is probably just related to circulation in his neuropathy he can restart his gabapentin pulses in the foot seems fine Assessment & Plan:  Malnutrition Muscle wasting Significant fatigue tiredness Anemia Lab work indicated Protein supplementation recommended at least 45 g protein per day Patient  to recheck here within 4-6 weeks time Patient will have follow-up with surgeon later this week. Warning signs regarding C. difficile discussed. Patient with significant pain discomfort was given a prescription for hydrocodone may use every 4-6 hours as needed. Caution drowsiness. Not for long-term use as his pain gets better he was encouraged to reduce the use of pain medicine. Follow-up in approximately one month  Addendum- pain medication was for foot pain as well as peripheral neuropathy and also abdominal soreness with coughing

## 2015-10-20 ENCOUNTER — Other Ambulatory Visit: Payer: Self-pay

## 2015-10-20 ENCOUNTER — Other Ambulatory Visit: Payer: Self-pay | Admitting: Radiology

## 2015-10-20 ENCOUNTER — Emergency Department (HOSPITAL_COMMUNITY)
Admission: EM | Admit: 2015-10-20 | Discharge: 2015-10-31 | Disposition: E | Payer: BLUE CROSS/BLUE SHIELD | Attending: Emergency Medicine | Admitting: Emergency Medicine

## 2015-10-20 ENCOUNTER — Encounter (HOSPITAL_COMMUNITY): Payer: Self-pay | Admitting: *Deleted

## 2015-10-20 DIAGNOSIS — I469 Cardiac arrest, cause unspecified: Secondary | ICD-10-CM | POA: Diagnosis not present

## 2015-10-20 DIAGNOSIS — E119 Type 2 diabetes mellitus without complications: Secondary | ICD-10-CM | POA: Insufficient documentation

## 2015-10-20 HISTORY — DX: Type 2 diabetes mellitus without complications: E11.9

## 2015-10-20 LAB — CBG MONITORING, ED: Glucose-Capillary: 252 mg/dL — ABNORMAL HIGH (ref 65–99)

## 2015-10-20 MED ORDER — EPINEPHRINE HCL 0.1 MG/ML IJ SOSY
PREFILLED_SYRINGE | INTRAMUSCULAR | Status: AC | PRN
  Administered 2015-10-20 (×3): 1 mg via INTRAVENOUS

## 2015-10-20 MED ORDER — SODIUM BICARBONATE 8.4 % IV SOLN
INTRAVENOUS | Status: AC
  Filled 2015-10-20: qty 100

## 2015-10-20 MED ORDER — CALCIUM CHLORIDE 10 % IV SOLN
INTRAVENOUS | Status: AC | PRN
  Administered 2015-10-20: 1 g via INTRAVENOUS

## 2015-10-20 MED ORDER — SODIUM BICARBONATE 8.4 % IV SOLN
INTRAVENOUS | Status: AC | PRN
  Administered 2015-10-20 (×2): 50 meq via INTRAVENOUS

## 2015-10-21 ENCOUNTER — Other Ambulatory Visit: Payer: Self-pay

## 2015-10-26 ENCOUNTER — Telehealth: Payer: Self-pay | Admitting: Family Medicine

## 2015-10-26 ENCOUNTER — Ambulatory Visit (HOSPITAL_COMMUNITY): Payer: BLUE CROSS/BLUE SHIELD | Admitting: Physical Therapy

## 2015-10-26 MED FILL — Medication: Qty: 1 | Status: AC

## 2015-10-26 NOTE — Telephone Encounter (Signed)
Wife dropped off a form to be filled out regarding the pt's death in order for the life insurance claim to be fulfilled. Form in nurse box.

## 2015-10-28 ENCOUNTER — Other Ambulatory Visit: Payer: Self-pay

## 2015-10-28 NOTE — Telephone Encounter (Signed)
This form was filled out. It also should be noted that I met with his wife and son to discuss the day that Mr. hausen past. It does seem that this patient more than likely passed from a pulmonary embolism but patient did not have autopsy so therefore it is difficult to say with 660% certainty.

## 2015-10-31 NOTE — ED Notes (Signed)
EMS responded to call for respiratory distress. Pt was alert upon arrival, once in truck pt became unresponsive with asystole.

## 2015-10-31 NOTE — ED Notes (Addendum)
Per EDP, no Medical Examiner case.  Approx 1035, this RN present when EDP spoke with pt family.

## 2015-10-31 NOTE — Code Documentation (Signed)
CPR paused, [name] being used to assess heart.

## 2015-10-31 NOTE — ED Notes (Signed)
Pt placed in body bag. Security will escort nurse and pt to [place].

## 2015-10-31 NOTE — Code Documentation (Signed)
Pulse check, no palpable pulse at this time.

## 2015-10-31 NOTE — ED Notes (Signed)
Pt transported to morgue by nursing staff and security.

## 2015-10-31 NOTE — Code Documentation (Signed)
CPR restarted.

## 2015-10-31 NOTE — ED Notes (Signed)
Family at bedside. 

## 2015-10-31 NOTE — ED Provider Notes (Addendum)
CSN: [id number]     Arrival date & time November 14, 2015  1006 History  By signing my name below, I, Eddie Foley, attest that this documentation has been prepared under the direction and in the presence of Jola Schmidt, MD.  Electronically Signed: Reola Foley, ED Scribe. 2015/11/14. 10:29 AM.  Chief Complaint  Patient presents with  . Cardiac Arrest    The history is provided by the patient. History limited by: level V caveat: cardiac arrest.   HPI Comments: Eddie Foley is a 60 y.o. male BIB EMS with a PMHx significant for GERD who presents to the Emergency Department in active cardiac arrest onset 9:43am. Per EMS pt was having difficulty breathing and weakness in his extremities PTA. EMS reports that pt was alert and suddenly became unresponsive and presented with asystole. En route, EMS began chest compressions and resuscitation efforts at 9:43am. Pt was given 7 of Epinephrine and 1 of Narcan.  Recently discharged from [facility] on 10/15/2015 after upper laparoscopic takedown of diverticular abscess with resultant drainsstill in place  Past Medical History  Diagnosis Date  . Hernia 0000000    umbilical  . GERD (gastroesophageal reflux disease)   . Neuropathy (HCC)     legs  . Impaired fasting glucose   . Reactive airway disease   . Prostate infection   . Paraparesis of both lower limbs ([place]) 03/12/2015   Past Surgical History  Procedure Laterality Date  . Cholecystectomy    . Hernia repair  0000000    umbilical hernia  . Shoulder arthroscopy      left  . Colon resection N/A 10/02/2015    Procedure: LAPAROSCOPIC DRAINAGE OF ABDOMINAL ABSCESS, LAVAGE, PLACEMENT OF DRAINS;  Surgeon: Jackolyn Confer, MD;  Location: [facility];  Service: General;  Laterality: N/A;   Family History  Problem Relation Age of Onset  . Heart disease Mother   . Lung cancer Father     lung  . Prostate cancer Brother   . Heart disease Brother   . Diabetes Other   . Diabetes Other   .  Healthy Son     x 3  . Healthy Daughter   . Neuropathy Neg Hx    Social History  Substance Use Topics  . Smoking status: Never Smoker   . Smokeless tobacco: Not on file  . Alcohol Use: No    Review of Systems  Unable to perform ROS: Patient unresponsive     Allergies  Bee pollen; Bee venom; and Tape  Home Medications   Prior to Admission medications   Medication Sig Start Date End Date Taking? Authorizing Provider  acetaminophen (TYLENOL) 325 MG tablet Take 2 tablets (650 mg total) by mouth every 4 (four) hours as needed for mild pain, moderate pain or fever. 10/16/15   Earnstine Regal, PA-C  albuterol (PROVENTIL HFA;VENTOLIN HFA) 108 (90 Base) MCG/ACT inhaler Inhale 2 puffs into the lungs every 4 (four) hours as needed for wheezing. 10/19/15   Kathyrn Drown, MD  amoxicillin-clavulanate (AUGMENTIN) 875-125 MG tablet Take 1 tablet by mouth every 12 (twelve) hours. 10/16/15   Earnstine Regal, PA-C  benzonatate (TESSALON PERLES) 100 MG capsule Take 1 capsule (100 mg total) by mouth every 6 (six) hours as needed for cough. 10/19/15   Kathyrn Drown, MD  benzonatate (TESSALON) 100 MG capsule Take 1 capsule (100 mg total) by mouth 3 (three) times daily as needed for cough. 10/19/15   Kathyrn Drown, MD  EPINEPHrine 0.3 mg/0.3 mL  IJ SOAJ injection Inject 0.3 mLs (0.3 mg total) into the muscle once. 09/24/14   Kathyrn Drown, MD  famotidine (PEPCID) 20 MG tablet Take 20 mg by mouth daily as needed for heartburn or indigestion.     Historical Provider, MD  gabapentin (NEURONTIN) 100 MG capsule  09/09/15   Historical Provider, MD  gabapentin (NEURONTIN) 300 MG capsule  10/19/15   Historical Provider, MD  HYDROcodone-acetaminophen (NORCO) 10-325 MG tablet Take 1 tablet by mouth every 4 (four) hours as needed. 10/19/15   Kathyrn Drown, MD  NEXIUM 40 MG capsule TAKE ONE CAPSULE BY MOUTH ONCE DAILY 06/04/13   Kathyrn Drown, MD  ondansetron (ZOFRAN-ODT) 4 MG disintegrating tablet Take 1 tablet (4 mg  total) by mouth every 8 (eight) hours as needed for nausea. 10/16/15   Earnstine Regal, PA-C  oxyCODONE (OXY IR/ROXICODONE) 5 MG immediate release tablet Take 1-2 tablets (5-10 mg total) by mouth every 4 (four) hours as needed for moderate pain, severe pain or breakthrough pain. 10/16/15   Earnstine Regal, PA-C   BP 74/56 mmHg  Pulse 30  Resp 30 Physical Exam  Constitutional: He appears well-developed and well-nourished.  HENT:  Head: Normocephalic and atraumatic.  Eyes:  Pupils fixed and dilated  Neck: Neck supple.  Cardiovascular:  Absent heart sounds.  Pulses present with CPR only  Pulmonary/Chest:  Bilateral breath sounds with bag King ventilations  Abdominal: Soft.  Bilateral lower quadrant drains in place  Neurological:  GCS 3  Skin:  Cyanotic appearing  Psychiatric: He has a normal mood and affect.  Nursing note and vitals reviewed.   ED Course  Procedures (including critical care time)   +++++++++++++++++++++++++++++++++++++++++++++++++++++++++++++++++++++   INTUBATION Performed by: Hoy Morn Required items: required blood products, implants, devices, and special equipment available Patient identity confirmed: provided demographic data and hospital-assigned identification number Time out: Immediately prior to procedure a "time out" was called to verify the correct patient, procedure, equipment, support staff and site/side marked as required. Indications: cardiac arrest Intubation method: video laryngoscope Preoxygenation: KING Sedatives: none Paralytic: none Tube Size: 7.5 cuffed Post-procedure assessment: chest rise and ETCO2 monitor Breath sounds: equal and absent over the epigastrium Tube secured with: ETT holder Patient tolerated the procedure well with no immediate complications.     Cardiopulmonary Resuscitation (CPR) Procedure Note Directed/Performed by: Hoy Morn I personally directed ancillary staff and/or performed CPR in an effort to  regain return of spontaneous circulation and to maintain cardiac, neuro and systemic perfusion.          EMERGENCY DEPARTMENT [name] CARDIAC EXAM "Study: Limited Ultrasound of the heart and pericardium" INDICATIONS:Cardiac arrest Multiple views of the heart and pericardium are obtained with a multi-frequency probe. PERFORMED [id number] IMAGES ARCHIVED?: Yes FINDINGS: No pericardial effusion and No coordinated contractionis LIMITATIONS:  Body habitus VIEWS USED: Subcostal 4 chamber INTERPRETATION: Cardiac activity absent COMMENT:  Absent cardiac activity   ++++++++++++++++++++++++++++++++++++++++++++++++++++++++++++++++   EKG Interpretation  Date/Time:  13-Nov-2015 10:14:19 EDT Ventricular Rate:  71 PR Interval:    QRS Duration: 162 QT Interval:  398 QTC Calculation: 433 R Axis:   120 Text Interpretation:  Sinus arrhythmia RBBB and LPFB RBBB is new since prior tracing Confirmed by Jaqualyn Juday  MD, Lennette Bihari ([id number]) on Nov 13, 2015 12:16:16 PM        MDM   Final diagnoses:  Cardiac arrest ([place])    PA on arrival to the emergency department.  Standard ACLS performed.  King airway exchanged for a 7.5 endotracheal tube.  Despite standard ACLS no sustainable return of circulation was able to be obtained.  Transient pulse present for 20-30 seconds only.  After this no other return of spontaneous circulation was able to be obtained.  Prolonged downtime with approximately 45 minutes of CPR  Time of death 1021am  Family updated.  Chaplain will be brought to the bedside for support   I personally performed the services described in this documentation, which was scribed in my presence. The recorded information has been reviewed and is accurate.     TIME OF DEATH: 10:21AM   10:48 AM Spoke with medical examiner who declines ME case at this time Jola Schmidt, MD 10-30-15 [place], MD 10/30/2015 [place], MD 10/30/15 [phone]

## 2015-10-31 NOTE — ED Notes (Signed)
Pt IV and IO removed. Pt was extubated as well.

## 2015-10-31 NOTE — ED Notes (Signed)
Pt family requesting [facility].   Family approving tissue and eye donation.

## 2015-10-31 NOTE — Code Documentation (Signed)
CPR continued. 

## 2015-10-31 NOTE — Code Documentation (Signed)
Pulse check, no pulses palpable. [name] being used.  CPR re-started

## 2015-10-31 NOTE — ED Notes (Addendum)
CDS made aware of patient. Pt qualifies for tissue and eye donation. Family being made aware of situation.    REF number : [id number]

## 2015-10-31 NOTE — Code Documentation (Signed)
Time of Death called by Venora Maples, [place]

## 2015-10-31 NOTE — Code Documentation (Signed)
Pulse check, pulse palpable. CPR stopped.

## 2015-10-31 DEATH — deceased

## 2015-11-10 ENCOUNTER — Ambulatory Visit: Payer: BLUE CROSS/BLUE SHIELD | Admitting: Family Medicine

## 2015-12-14 ENCOUNTER — Ambulatory Visit: Payer: Self-pay | Admitting: Neurology

## 2017-01-25 IMAGING — CT CT ABD-PELV W/ CM
2 of 5 series · 16 of 46 positions shown, 18 images · IV contrast (ISOVUE)
Comparison: 10/06/2015 and 09/29/2015.

CLINICAL DATA: Followup for diverticulitis. Patient is improved
clinically. 30 mL of areola drainage from the JP drain this morning.

EXAM:
CT ABDOMEN AND PELVIS WITH CONTRAST
TECHNIQUE: Multidetector CT imaging of the abdomen and pelvis was performed
using the standard protocol following bolus administration of
intravenous contrast.
CONTRAST:  100mL JBOUH1-6SS IOPAMIDOL (JBOUH1-6SS) INJECTION 61%

[Series 2: rtn a/p with · axial · 0.93mm/px · z∈[-506,-86]mm · 13 of 98 slices shown, 15 images]
[im 7/98  soft-tissue]
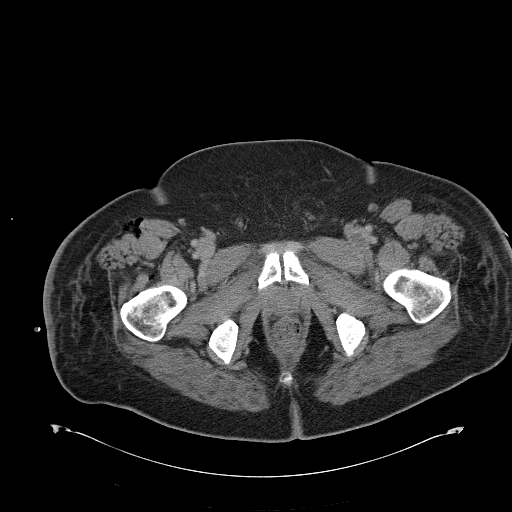
[im 7/98  bone]
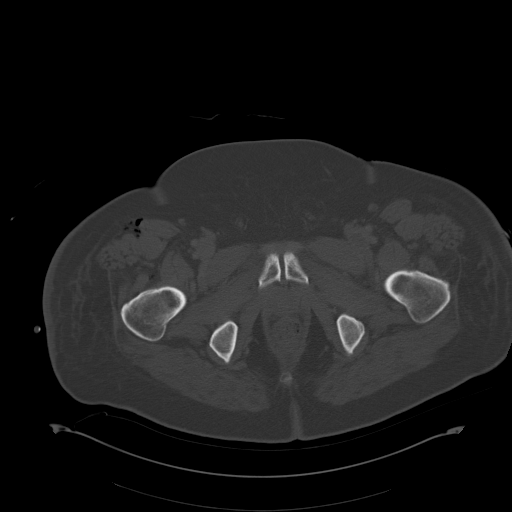
[im 14/98  soft-tissue]
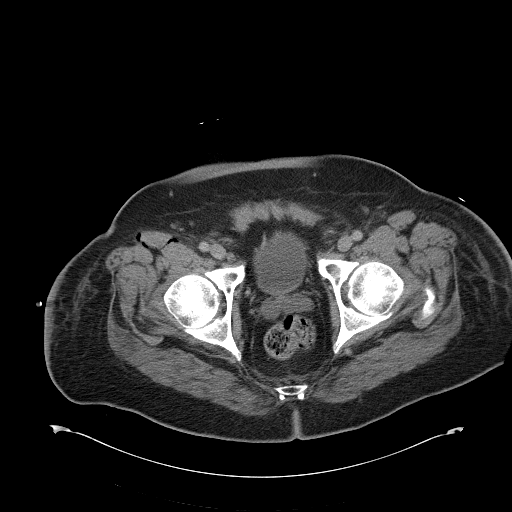
[im 21/98  soft-tissue]
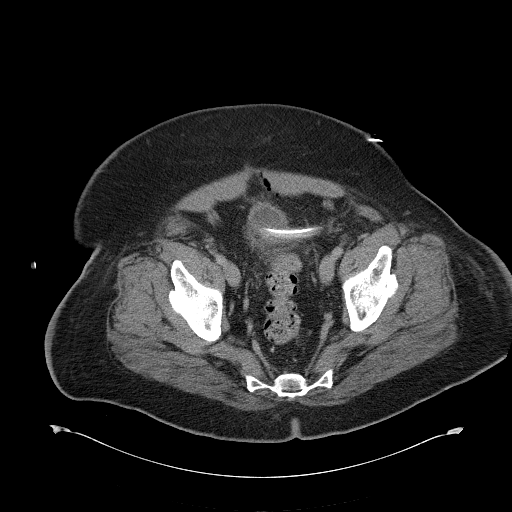
[im 28/98  soft-tissue]
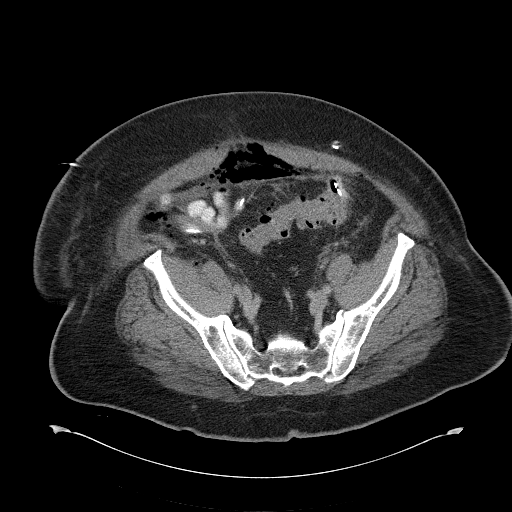
[im 35/98  soft-tissue]
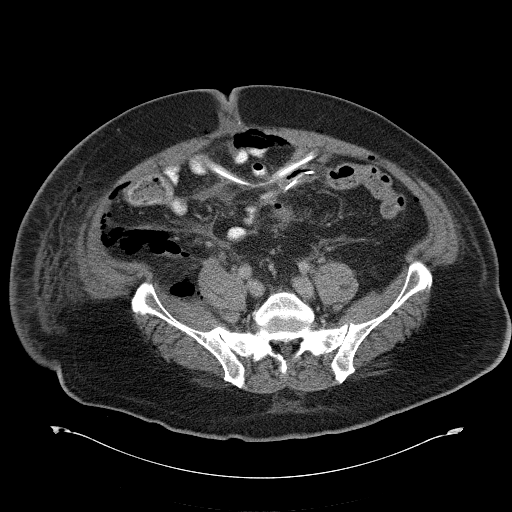
[im 42/98  soft-tissue]
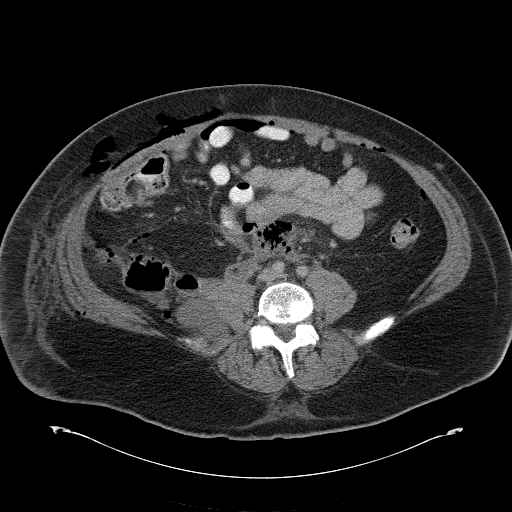
[im 49/98  soft-tissue]
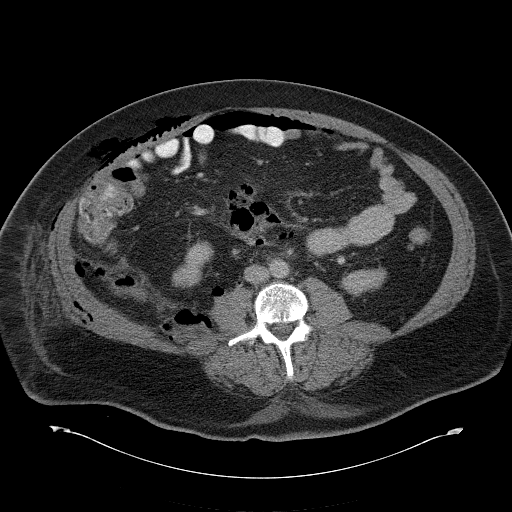
[im 56/98  soft-tissue]
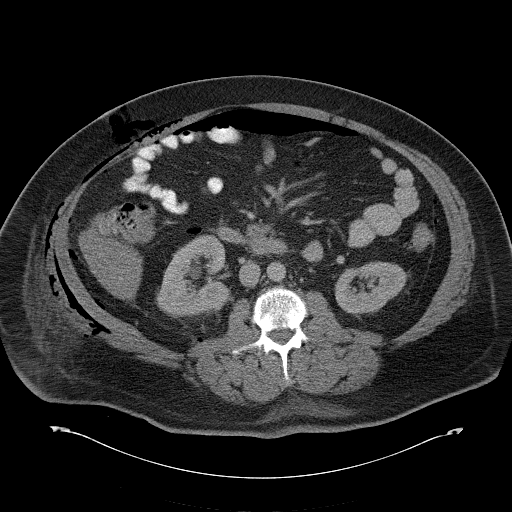
[im 63/98  soft-tissue]
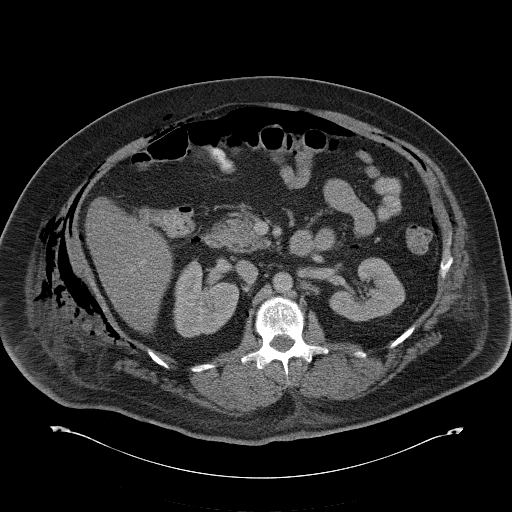
[im 63/98  bone]
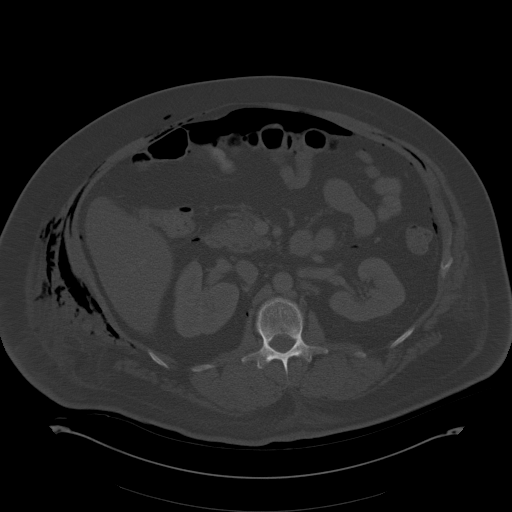
[im 70/98  soft-tissue]
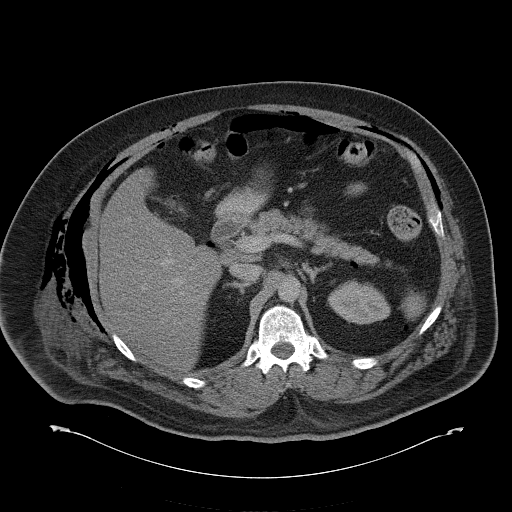
[im 77/98  soft-tissue]
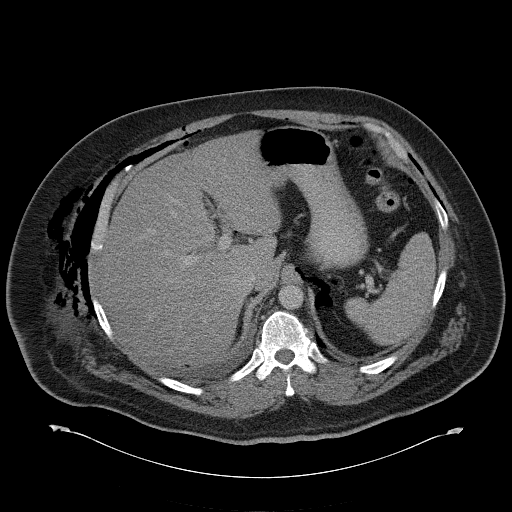
[im 84/98  soft-tissue]
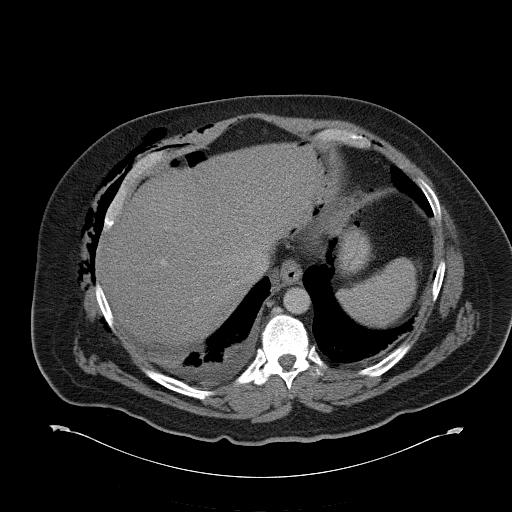
[im 91/98  soft-tissue]
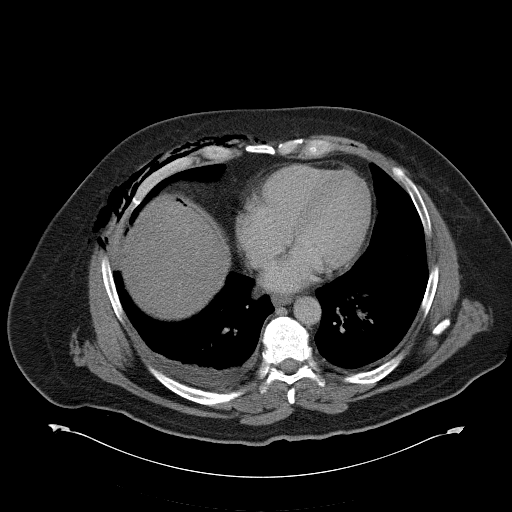

[Series 602: <mpr thick range> · coronal · 0.96mm/px · 3 of 183 slices shown]
[im 61/183  soft-tissue]
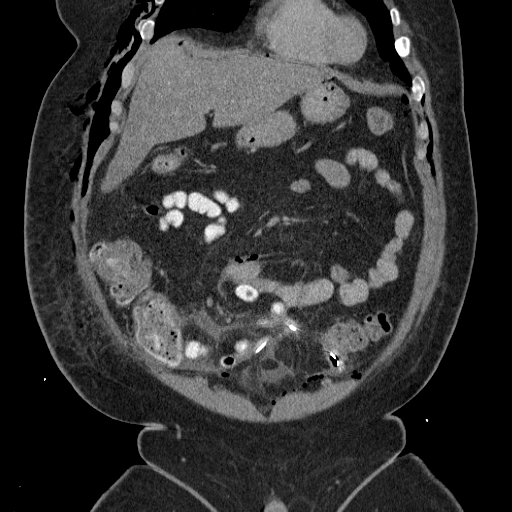
[im 81/183  soft-tissue]
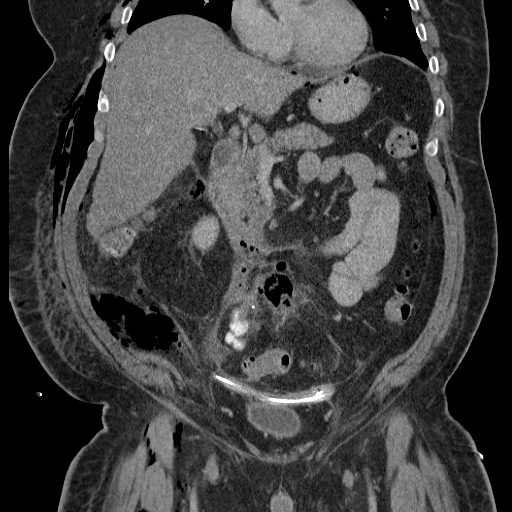
[im 102/183  soft-tissue]
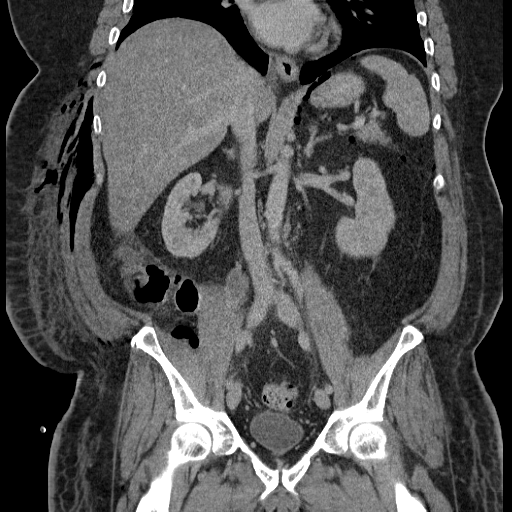

[16 of 46 positions shown; findings below may reference images not displayed]

FINDINGS: There is significant intraperitoneal air as well as air tracking
along the abdominal wall. Air also tracks along the anterior 5
musculature. The amount of intraperitoneal air is slightly decreased
from the prior study with the amount of abdominal wall air mildly
increased. The surgical drains are stable. There is focal
inflammatory type opacity with associated air in the posterior
central to lower abdomen anterior to the region of the aortic
bifurcation. This is without substantial change. There is no defined
collection to suggest an abscess. Inflammatory changes adjacent to
the sigmoid colon no mild improvement when compared to the study
dated 09/29/2015. There are no new areas of colonic inflammation.
There is no evidence of bowel obstruction.

Lung bases: Small right and minimal left pleural effusions.
Associated dependent subsegmental atelectasis, greater on the right.

Hepatobiliary: Stable hepatic steatosis. No liver mass or focal
lesion. Status post cholecystectomy. No bile duct dilation.

Spleen, pancreas, adrenal glands:  Unremarkable.

Kidneys, ureters, bladder:  Unremarkable.

Lymph nodes:  No adenopathy.

Ascites:  No significant free fluid.
IMPRESSION: 1. No defined abscess.
2. There is significant intraperitoneal air as well as abdominal
wall air, overall similar in amount to the prior CT, with a slight
decrease in the intraperitoneal air, but a mild increase in the
abdominal wall air. The relative persistence of this air since the
laparoscopic procedure suggests a persistent bowel leak.
3. No evidence of bowel obstruction. No new bowel inflammatory
change. Since CT dated 09/29/2015, there has been mild improvement
in the inflammation adjacent to the sigmoid colon.
